# Patient Record
Sex: Female | Born: 2000
Health system: Southern US, Community
[De-identification: ages and names within clinical notes are randomized; demographics above are authoritative.]

## PROBLEM LIST (undated history)

## (undated) DIAGNOSIS — F32A Depression, unspecified: Secondary | ICD-10-CM

## (undated) DIAGNOSIS — F419 Anxiety disorder, unspecified: Secondary | ICD-10-CM

## (undated) DIAGNOSIS — F329 Major depressive disorder, single episode, unspecified: Secondary | ICD-10-CM

## (undated) DIAGNOSIS — L301 Dyshidrosis [pompholyx]: Secondary | ICD-10-CM

## (undated) DIAGNOSIS — F332 Major depressive disorder, recurrent severe without psychotic features: Secondary | ICD-10-CM

## (undated) DIAGNOSIS — B029 Zoster without complications: Secondary | ICD-10-CM

## (undated) DIAGNOSIS — T7840XA Allergy, unspecified, initial encounter: Secondary | ICD-10-CM

## (undated) DIAGNOSIS — H539 Unspecified visual disturbance: Secondary | ICD-10-CM

## (undated) HISTORY — DX: Allergy, unspecified, initial encounter: T78.40XA

## (undated) HISTORY — DX: Dyshidrosis (pompholyx): L30.1

## (undated) HISTORY — DX: Zoster without complications: B02.9

## (undated) HISTORY — DX: Major depressive disorder, recurrent severe without psychotic features: F33.2

---
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
## 2018-01-03 ENCOUNTER — Other Ambulatory Visit: Payer: Self-pay | Admitting: Family Medicine

## 2018-01-03 MED FILL — TRI FEMYNOR 28 TABLET: 0.18/0.215/ | 84 days supply | Qty: 84 | Fill #0

## 2018-03-14 MED FILL — TRI FEMYNOR 28 TABLET: 0.18/0.215/ | 84 days supply | Qty: 84 | Fill #1

## 2018-04-05 DIAGNOSIS — H5213 Myopia, bilateral: Secondary | ICD-10-CM | POA: Diagnosis not present

## 2018-06-01 MED FILL — TRI FEMYNOR 28 TABLET: 0.18/0.215/ | 84 days supply | Qty: 84 | Fill #2

## 2018-08-15 ENCOUNTER — Other Ambulatory Visit: Payer: Self-pay | Admitting: Family Medicine

## 2018-08-15 MED FILL — TRI FEMYNOR 28 TABLET: 0.18/0.215/ | 84 days supply | Qty: 84 | Fill #0

## 2018-09-27 NOTE — Patient Instructions (Addendum)
HEALTH MAINTENANCE RECOMMENDATIONS:  It is recommended that you get at least 30 minutes of aerobic exercise at least 5 days/week (for weight loss, you may need as much as 60-90 minutes). This can be any activity that gets your heart rate up. This can be divided in 10-15 minute intervals if needed, but try and build up your endurance at least once a week.  Weight bearing exercise is also recommended twice weekly.  Eat a healthy diet with lots of vegetables, fruits and fiber.  "Colorful" foods have a lot of vitamins (ie green vegetables, tomatoes, red peppers, etc).  Limit sweet tea, regular sodas and alcoholic beverages, all of which has a lot of calories and sugar.  Drink a lot of water.  Calcium recommendations are 1200-1500 mg daily (1500 mg for postmenopausal women or women without ovaries), and vitamin D 1000 IU daily.  This should be obtained from diet and/or supplements (vitamins), and calcium should not be taken all at once, but in divided doses.  Monthly self breast exams and yearly mammograms for women over the age of 20 is recommended.  Sunscreen of at least SPF 30 should be used on all sun-exposed parts of the skin when outside between the hours of 10 am and 4 pm (not just when at beach or pool, but even with exercise, golf, tennis, and yard work!)  Use a sunscreen that says "broad spectrum" so it covers both UVA and UVB rays, and make sure to reapply every 1-2 hours.  Remember to change the batteries in your smoke detectors when changing your clock times in the spring and fall. Carbon monoxide detectors are recommended for the home.  Use your seat belt every time you are in a car, and please drive safely and not be distracted with cell phones and texting while driving.  Condoms are recommended for prevention of sexually transmitted diseases. Avoid alcohol, cigarettes, vaping and other recreational drugs.  We are going to check blood work (as well as the EKG done today) to evaluate  your rapid heartbeat episodes. I suspect paroxysmal SVT (see information below).  If your labs are normal, we are going to send you to the cardiologist to get a heart monitor, and possibly and echocardiogram (ultrasound of the heart). As we discussed, try and count your pulse when having one of these episodes (how many beats per minute--okay to check for 15 seconds and multiply times 4 if that is easier); and try to see if there are any triggers (ie dehydration) that can be avoided.   Supraventricular Tachycardia, Adult Supraventricular tachycardia (SVT) is a kind of abnormal heartbeat. It makes your heart beat very fast and then beat at a normal speed. A normal resting heartbeat is 60-100 times a minute. This condition can make your heart beat more than 150 times a minute. Times of having a fast heartbeat (episodes) can be scary, but they are usually not dangerous. However, in some cases, they can lead to heart failure if:  They happen several times per day.  Last for longer than a few seconds. What are the causes? Usually, a normal heartbeat starts when an area called the sinoatrial node releases an electrical signal. In SVT, other areas of the heart send out electrical signals that interfere with the signal from the sinoatrial node. What increases the risk?  Being [age] years old.  Being a woman. The following factors may make you more likely to develop this condition:  Stress.  Tiredness.  Smoking.  Stimulant drugs, such  as cocaine and methamphetamine  Alcohol.  Caffeine.  Pregnancy.  Anxiety. What are the signs or symptoms?  A pounding heart. ? A feeling that your heart is skipping beats (palpitations). ? Weakness. ? Trouble getting enough air. ? Pain or tightness in your chest. ? Feeling like you are going to pass out. ? Feeling worried or nervous. ? Dizziness. ? Sweating. ? Feeling like you might throw up. ? Passing out (fainting). ? Tiredness. Sometimes, there  are no symptoms. How is this treated?  Vagal nerve stimulation. Ways to do this: ? Holding your breath and pushing, as though you are pooping. ? Massaging an area on one side of your neck. Do not try this yourself. Only a doctor should do this. If done the wrong way, it can lead to a stroke. ? Bending forward with your head between your legs. ? Coughing while bending forward with your head between your legs. ? Closing your eyes and massaging your eyeballs. Ask a doctor how to do this.  Medicines that prevent attacks.  Medicine to stop an attack given through an IV at the hospital.  A small electric shock (cardioversion) that stops an attack.  Radiofrequency ablation. In this procedure, a small, thin tube (catheter) is used to send radiofrequency energy to the area that is causing the rapid heartbeats. Follow these instructions at home: Stress   Avoid things that make you feel stressed.  To deal with stress, try: ? Doing yoga, meditation, or being out in nature. ? Listening to relaxing music. ? Doing deep breathing. ? Taking steps to be healthy, such as getting lots of sleep, exercising, and eating a balanced diet. ? Talking with a mental health doctor. Sleep  Try to get at least 7 hours of sleep each night. Tobacco and nicotine   Do not use any products that contain nicotine or tobacco, such as cigarettes, e-cigarettes, and chewing tobacco. If you need help quitting, ask your doctor. Alcohol  If alcohol gives you a fast heartbeat, do not drink alcohol.  Even in alcohol does not seem to give you a fast heartbeat, limit alcohol use,. ? For nonpregnant women, this means no more than 1 drink a day. ? For men, this means no more than 2 drinks a day.  "One drink" means one of these:  12 oz of beer (355 mL).  5 oz of wine (148 mL)  1 oz of hard liquor (44 mL). Caffeine  If caffeine gives you a fast heartbeat, do not eat, drink, or use anything with caffeine in  it.  Even if caffeine does not seem to give you a fast heartbeat, limit how much caffeine you eat, drink, or use. Stimulant drugs  Do not use drugs such as cocaine or methamphetamine. If you need help quitting, ask your doctor. General instructions  Stay at a healthy weight.  Exercise regularly. Ask your doctor about good activities for you. Try one of these: ? 150 minutes a week of gentle exercise, like walking or yoga. ? 75 minutes a week of exercise that is very active, like running or swimming.  Try a combination of gentle exercise and very active exercise.  Do home treatments to slow down your heartbeat.  Take over-the-counter and prescription medicines only as told by your doctor. Contact a doctor if:  You have a fast heartbeat more often.  Times of having a fast heartbeat last longer than before.  Home treatments to slow down your heartbeat do not help.  You have new  symptoms. Get help right away if:  You have chest pain.  Your symptoms get worse.  You have trouble breathing.  Your heart beats very fast for more than 20 minutes.  You pass out. These symptoms may be an emergency. Do not wait to see if the symptoms will go away. Get medical help right away. Call your local emergency services (911 in the [place] Do not drive yourself to the hospital. Summary  SVT is a type of abnormal heart beat.  During an episode, our heart rate may be higher than 150 beats per minute  Treatment depends on how often the condition happens and your symptoms. This information is not intended to replace advice given to you by your health care provider. Make sure you discuss any questions you have with your health care provider. Document Released: 08/10/2005 Document Revised: 04/16/2016 Document Reviewed: 04/16/2016 Elsevier Interactive Patient Education  2019 [employer].

## 2018-09-27 NOTE — Progress Notes (Signed)
Chief Complaint  Patient presents with  . Annual Exam    nonfasting (had lunch at noon) annual exam. Sees eye doctor yearly. No concerns.     Julia Vasquez is a 18 y.o. female who presents for a complete physical.  She has the following concerns:  She will notice her heart beating very fast, feels it up into her throat.  It will occur sometimes while at rest, sometimes when dancing.    Starts abruptly. Occurring about once a week.  No associated chest pain or shortness of breath, not lightheaded/dizzy with episodes.  Lasts a few minutes, then spontaneously goes back to a normal heartbeat (sudden switch, not gradual). First noticed a couple of months ago, increased in frequency Tea 2x/week, otherwise no caffeine. Denies decongestants or other stimulants. Denies being dehydrated when this occurs.  Hand eczema:  Starting to flare again. Has been using Eucrisa (samples she got from dermatologist), which helps.  Gets migraines once a month, relieved by Aleve or Tylenol. Sleep paralysis--not as often in the past, just up to 2x/month.   Well child screening reviewed in epic. Alcohol (irregular use; last drank a couple of months ago, just 1 drink) Marijuana in past, not regular, last a couple of months ago. Tobacco exposure: boyfriend, mother and brother (smokes outside) 5 hours screen time/d--mostly watching TV No longer making the YouTube makeup videos (looking to find a new app). +sexually active. Uses condoms.  Reports she and her boyfriend broke up in August, got back together in November, and became sexually active since getting back together.  +condom use, and is on OCP's. PHQ-2 score of 0.  She wears contacts, sees eye doctor yearly.    Hyperlipidemia:  This was noted on screening labs in 2017.  Her mother has hyperlipidemia and is on statin, and there is FHx of CAD (MGM).  We reviewed low cholesterol diet and her last visit, but she never returned for recheck as recommended. She  is not fasting today.  Lab Results  Component Value Date   CHOL 268 (H) 06/10/2016   HDL 59 06/10/2016   LDLCALC 162 (H) 06/10/2016   TRIG 236 (H) 06/10/2016   CHOLHDL 4.5 06/10/2016   At her visit last year (prior to discussion of low cholesterol diet), she reported the following diet: Red meat daily (meatloaf, in pasta, chili), eggs just once/week.  +ranch dressing, creamy soup (potato), ice cream, alfredo sauces. Some fried chicken. +sweet tea daily.  Current diet: no longer drinks much sweet tea (2x/week), eating more chicken, less red meat (2x/week--tacos, spaghetti, lasagna); no longer having much alfredo sauce.  +ranch dressing; frequent salads, with some parmesan cheese.  H/o Depression/Anxiety:She is no longer being seen byDr. Dwyane Dee. She was hospitalized with SI in the past (02/2015).She [facility] saw [facility]. Denies any problems currently with depression, anxiety. No longer takes hydroxyzine.  She falls asleep fine, still sometimes has sleep paralysis, less often (2x/month).  Acne: Usingan OTC face wash, and is on OCP's, and skin is doing very well.  Chest pain: has some pain a couple of months ago, but overall no longer gets much chest pain. (h/o frequent costochondritis)  Menarche: age 73 On OCP's. Had some irregular periods prior to being started, and also had dysmenorrhea. Menses are regular now on OCPs.Periods lasts for 5 days, heavy for the first day only. She still has cramps, only on the first day (not as bad as in the past, when she used to miss school each month). She  takes tylenol the first day, which is effective. In a relationship with same person x 5 years.  Broke up in August, back together since November. Uses condoms, denies unprotected sex. Denies any vaginal discharge, lesions or other concerns  Education:[place] then finished credits online [employer], graduated the end of 2017.  Wants  business degree--plans online school at some point in future. Would like to own her own business (unsure if salon, restaurant).   Immunization History  Administered Date(s) Administered  . DTaP 01/31/1999, 04/03/1999, 06/20/1999, 06/03/2000, 12/11/2003  . H1N1 06/18/2008, 07/18/2008  . HPV Quadrivalent 03/19/2010, 08/22/2010, 05/04/2013  . Hepatitis A 06/29/2006  . Hepatitis A, Ped/Adol-2 Dose 05/04/2013  . Hepatitis B 01/31/1999, 09/03/1999, 12/07/1999  . HiB (PRP-OMP) 04/03/1999, 06/20/1999, 06/03/2000  . IPV 01/31/1999, 06/20/1999, 09/03/1999, 06/03/2000  . Influenza Whole 06/07/2006  . Influenza,Quad,Nasal, Live 05/31/2013, 07/17/2014  . MMR 12/25/1999, 12/11/2003  . Meningococcal Conjugate 05/04/2013  . Pneumococcal Conjugate-13 01/31/1999, 06/20/1999, 09/03/1999, 06/03/2000  . Tdap 03/19/2010  . Varicella 12/25/1999  +varicella IgG 05/2016  Dentist: twice a year Ophtho: yearly Exercise: Yoga daily; dancing and weights daily. At least 30 mins of cardio daily.  She has repeatedly declined recommended vaccinations including Menveo#2, Bexsero, flu shots (and advised that the meningitis vaccines would only be needed if going back to school).  Declines flu shot again today.  Vitamin D-OH 33 in 05/2016  Past Medical History:  Diagnosis Date  . Allergy   . Anxiety   . Chest pain   . Depression   . Dyshidrotic eczema    hand; sees Dr. LLedell PeoplesPA ([place] [facility]  . Shingles 07/2010  . Vision abnormalities    wears contacts    History reviewed. No pertinent surgical history.  Social History   Socioeconomic History  . Marital status: Single    Spouse name: Not on file  . Number of children: Not on file  . Years of education: Not on file  . Highest education level: Not on file  Occupational History  . Not on file  Social Needs  . Financial resource strain: Not on file  . Food insecurity:    Worry: Not on file    Inability: Not on file  . Transportation needs:     Medical: Not on file    Non-medical: Not on file  Tobacco Use  . Smoking status: Former SResearch scientist (life sciences) . Smokeless tobacco: Never Used  . Tobacco comment: smoked just occasionally for 1 year (age 89  Substance and Sexual Activity  . Alcohol use: No    Alcohol/week: 0.0 standard drinks    Comment: infrequent  . Drug use: Not Currently    Types: Marijuana    Comment: has occasionally used marijuana  . Sexual activity: Yes    Partners: Male    Birth control/protection: Condom, Pill  Lifestyle  . Physical activity:    Days per week: Not on file    Minutes per session: Not on file  . Stress: Not on file  Relationships  . Social connections:    Talks on phone: Not on file    Gets together: Not on file    Attends religious service: Not on file    Active member of club or organization: Not on file    Attends meetings of clubs or organizations: Not on file    Relationship status: Not on file  . Intimate partner violence:    Fear of current or ex partner: Not on file  Emotionally abused: Not on file    Physically abused: Not on file    Forced sexual activity: Not on file  Other Topics Concern  . 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 Graduated end of 2017.   Lives full time with mother, brother, brother's friend. Rolla Plate (her boyfriend) no longer lives in the home.  Sister is now living with her dad.    +tobacco exposure from mother and boyfriend and brother   + 4 cats (outside), 1 inside cat, and 6 inside dogs (3 little, 3 big).    Family History  Problem Relation Age of Onset  . Hyperlipidemia Mother   . Arthritis Father   . Gout Father   . Asthma Brother        exercise induced  . Bipolar disorder Brother   . Heart disease Maternal Grandmother        died of MI at 65  . Diabetes Maternal Grandfather   . Cancer Maternal Grandfather        colorectal  .  Colon cancer Maternal Grandfather 13  . Asthma Paternal Grandmother   . Hypertension Paternal Grandmother   . Diabetes Paternal Grandfather   . Diabetes Maternal Aunt     Outpatient Encounter Medications as of 09/28/2018  Medication Sig Note  . Multiple Vitamins-Minerals (MULTI-VITAMIN GUMMIES) CHEW Chew 1 tablet by mouth daily.   . TRI FEMYNOR 0.18/0.215/0.25 MG-35 MCG tablet TAKE 1 TABLET BY MOUTH ONCE DAILY   . Azelastine-Fluticasone 137-50 MCG/ACT SUSP Place 1 spray into the nose 2 (two) times daily. (Patient not taking: Reported on 09/22/2017) 06/10/2016: Uses prn allergies, not currently  . [DISCONTINUED] hydrocortisone cream 0.5 % Apply 1 application topically 2 (two) times daily.    No facility-administered encounter medications on file as of 09/28/2018.     Allergies  Allergen Reactions  . Cefuroxime Axetil Diarrhea  . Amoxil [Amoxicillin Trihydrate] Rash    ROS: Deniesfever, chills,dizziness. Denies heartburn.Normal bowels. Denies urinary complaints, vaginal discharge. Menses are regular on OCP's, cramps/painfor first day only. Currently denies allergies/congestion.No cough, shortness of breath. Denies depresssion, anxiety. Occasional ringing in ears, fluctuates back and forth between sides, short-lived. Not as often as in the past.  Denies hearing loss. +loud music. No further rash on her fingertips, but recently started having eczema flare on her knuckles again. Headaches, sleep paralysis and tachycardia, per HPI.   PHYSICAL EXAM:  BP 110/76   Pulse 68   Ht 5' 5"  (1.651 m)   Wt 135 lb 12.8 oz (61.6 kg)   LMP 08/28/2018 (Approximate)   BMI 22.60 kg/m   Wt Readings from Last 3 Encounters:  09/28/18 135 lb 12.8 oz (61.6 kg) (63 %, Z= 0.34)*  09/22/17 144 lb 9.6 oz (65.6 kg) (78 %, Z= 0.76)*  06/10/16 144 lb 3.2 oz (65.4 kg) (81 %, Z= 0.87)*   * Growth percentiles are based on CDC (Girls, 2-20 Years) data.    Well developed, pleasant female in no distress.In  good spirits HEENT: PERRL, EOMI, conjunctiva clear.TM's and EAC's normal. OP normal, very mild erythema of ATP, tonsils are normal. Moist mucus membranes. Nasal mucosa ismildly edematous with clear mucus noted. Sinuses are nontender Neck: supple Some shotty, nontender anterior cervical lymphadenopathy bilaterally.  No masses or thyromegaly, no carotid bruit. Heart: regular rate and rhythm without murmurs or ectopy Lungs: clear bilaterally with good air movement  Chest: nontender to palpation  Breasts: No nipple inversion, discharge, skin dimpling, breast masses or axillary lymphadenopathy. Tissue is asymmetric, more fibroglandular on left than on right. No dominant masses, nontender. Abdomen: soft, nontender, no organomegaly or mass.  Skin: skin on face and back is clear, without any significant acne. Dry, rough, thickened skin at her left 2nd MCP, less so over the remaining knuckles bilaterally, with a small spot on her right index finger as well. Extremities: No edema, normal pulses Neuro: alert and oriented. Normal strength, DTR's 2+. Normal gait  Back: no spinal tenderness, CVA tenderness. No scoliosis  Psych: normal mood, affect, hygiene and grooming.  Urine dip normal   ASSESSMENT/PLAN:  Annual physical exam - Plan: POCT Urinalysis DIP (Proadvantage Device), Lipid panel, CBC with Differential/Platelet, TSH, Comprehensive metabolic panel, HIV Antibody (routine testing w rflx), GC/Chlamydia Probe Amp  Mixed hyperlipidemia - reviewed lowfat, low cholesterol diet. To return for fasting lipids - Plan: Lipid panel  Dysmenorrhea - significantly improved on OCP's  Acne vulgaris - well controlled  Palpitations - suspect pSVT based on history. Normal EKG.  Check labs. Refer to cardiologist for eval/monitor after labs back. - Plan: CBC with Differential/Platelet, TSH, Comprehensive metabolic panel, EKG 74-FSEL  Hand eczema - counseled re: moisturizing, limiting hot water exposure,  eucrisa    Fasting lipids due--not fasting today; needs to return for labs Lipid, c-met, CBC, TSH, HIV  Previously declined all vaccines.  With no plans for college, only recommended flu shot today--declined.  Will need Menveo #2 and Bexsero #1 before attending college (not currently in her plans).  Counseled extensively re:  Low cholesterol diet,safe sex, substance abuse.Counseled re: sunscreen, seat belt, internet safety, safe social media Handout given

## 2018-09-28 ENCOUNTER — Encounter: Payer: Self-pay | Admitting: Family Medicine

## 2018-09-28 ENCOUNTER — Ambulatory Visit (INDEPENDENT_AMBULATORY_CARE_PROVIDER_SITE_OTHER): Payer: Commercial Managed Care - PPO | Admitting: Family Medicine

## 2018-09-28 VITALS — BP 110/76 | HR 68 | Ht 65.0 in | Wt 135.8 lb

## 2018-09-28 DIAGNOSIS — R002 Palpitations: Secondary | ICD-10-CM | POA: Diagnosis not present

## 2018-09-28 DIAGNOSIS — E782 Mixed hyperlipidemia: Secondary | ICD-10-CM

## 2018-09-28 DIAGNOSIS — N946 Dysmenorrhea, unspecified: Secondary | ICD-10-CM

## 2018-09-28 DIAGNOSIS — L700 Acne vulgaris: Secondary | ICD-10-CM

## 2018-09-28 DIAGNOSIS — L309 Dermatitis, unspecified: Secondary | ICD-10-CM

## 2018-09-28 DIAGNOSIS — Z0000 Encounter for general adult medical examination without abnormal findings: Secondary | ICD-10-CM

## 2018-09-28 LAB — POCT URINALYSIS DIP (PROADVANTAGE DEVICE)
Bilirubin, UA: NEGATIVE
Blood, UA: NEGATIVE
GLUCOSE UA: NEGATIVE mg/dL
Ketones, POC UA: NEGATIVE mg/dL
Leukocytes, UA: NEGATIVE
Nitrite, UA: NEGATIVE
Protein Ur, POC: NEGATIVE mg/dL
Specific Gravity, Urine: 1.015
Urobilinogen, Ur: NEGATIVE
pH, UA: 7.5 (ref 5.0–8.0)

## 2018-09-30 ENCOUNTER — Other Ambulatory Visit: Payer: Self-pay | Admitting: Family Medicine

## 2018-09-30 ENCOUNTER — Encounter: Payer: Self-pay | Admitting: Family Medicine

## 2018-09-30 DIAGNOSIS — Z113 Encounter for screening for infections with a predominantly sexual mode of transmission: Secondary | ICD-10-CM

## 2018-09-30 DIAGNOSIS — A749 Chlamydial infection, unspecified: Secondary | ICD-10-CM

## 2018-09-30 HISTORY — DX: Chlamydial infection, unspecified: A74.9

## 2018-09-30 LAB — GC/CHLAMYDIA PROBE AMP
Chlamydia trachomatis, NAA: POSITIVE — AB
Neisseria gonorrhoeae by PCR: NEGATIVE

## 2018-09-30 MED ORDER — AZITHROMYCIN 500 MG PO TABS: 1000.0000 mg | ORAL_TABLET | Freq: Every day | ORAL | 0 refills | Status: DC

## 2018-09-30 MED FILL — AZITHROMYCIN 500 MG TABLET: 500 | 2 days supply | Qty: 2 | Fill #0

## 2018-10-04 ENCOUNTER — Encounter: Payer: Self-pay | Admitting: Family Medicine

## 2018-10-05 ENCOUNTER — Encounter: Payer: Self-pay | Admitting: Family Medicine

## 2018-10-17 ENCOUNTER — Ambulatory Visit: Payer: Commercial Managed Care - PPO | Admitting: Family Medicine

## 2018-10-17 ENCOUNTER — Other Ambulatory Visit: Payer: 59

## 2018-10-17 DIAGNOSIS — E782 Mixed hyperlipidemia: Secondary | ICD-10-CM

## 2018-10-17 DIAGNOSIS — Z202 Contact with and (suspected) exposure to infections with a predominantly sexual mode of transmission: Secondary | ICD-10-CM | POA: Diagnosis not present

## 2018-10-17 DIAGNOSIS — R002 Palpitations: Secondary | ICD-10-CM

## 2018-10-17 DIAGNOSIS — Z113 Encounter for screening for infections with a predominantly sexual mode of transmission: Secondary | ICD-10-CM | POA: Diagnosis not present

## 2018-10-17 DIAGNOSIS — Z0000 Encounter for general adult medical examination without abnormal findings: Secondary | ICD-10-CM | POA: Diagnosis not present

## 2018-10-18 LAB — COMPREHENSIVE METABOLIC PANEL WITH GFR
ALT: 10 IU/L (ref 0–32)
AST: 16 IU/L (ref 0–40)
Albumin/Globulin Ratio: 1.7 (ref 1.2–2.2)
Albumin: 4.7 g/dL (ref 3.9–5.0)
Alkaline Phosphatase: 73 IU/L (ref 39–117)
BUN/Creatinine Ratio: 12 (ref 9–23)
BUN: 9 mg/dL (ref 6–20)
Bilirubin Total: 0.3 mg/dL (ref 0.0–1.2)
CO2: 20 mmol/L (ref 20–29)
Calcium: 9.8 mg/dL (ref 8.7–10.2)
Chloride: 102 mmol/L (ref 96–106)
Creatinine, Ser: 0.76 mg/dL (ref 0.57–1.00)
GFR calc Af Amer: 132 mL/min/1.73
GFR calc non Af Amer: 114 mL/min/1.73
Globulin, Total: 2.8 g/dL (ref 1.5–4.5)
Glucose: 76 mg/dL (ref 65–99)
Potassium: 4.3 mmol/L (ref 3.5–5.2)
Sodium: 140 mmol/L (ref 134–144)
Total Protein: 7.5 g/dL (ref 6.0–8.5)

## 2018-10-18 LAB — LIPID PANEL
Chol/HDL Ratio: 3.6 ratio (ref 0.0–4.4)
Cholesterol, Total: 236 mg/dL — ABNORMAL HIGH (ref 100–169)
HDL: 65 mg/dL
LDL Calculated: 134 mg/dL — ABNORMAL HIGH (ref 0–109)
Triglycerides: 185 mg/dL — ABNORMAL HIGH (ref 0–89)
VLDL Cholesterol Cal: 37 mg/dL (ref 5–40)

## 2018-10-18 LAB — CBC WITH DIFFERENTIAL/PLATELET
BASOS: 1 %
Basophils Absolute: 0.1 10*3/uL (ref 0.0–0.2)
EOS (ABSOLUTE): 0.1 10*3/uL (ref 0.0–0.4)
EOS: 2 %
Hematocrit: 39.5 % (ref 34.0–46.6)
Hemoglobin: 13.7 g/dL (ref 11.1–15.9)
Immature Grans (Abs): 0 10*3/uL (ref 0.0–0.1)
Immature Granulocytes: 0 %
Lymphocytes Absolute: 3.3 10*3/uL — ABNORMAL HIGH (ref 0.7–3.1)
Lymphs: 34 %
MCH: 30.8 pg (ref 26.6–33.0)
MCHC: 34.7 g/dL (ref 31.5–35.7)
MCV: 89 fL (ref 79–97)
Monocytes Absolute: 0.4 10*3/uL (ref 0.1–0.9)
Monocytes: 5 %
Neutrophils Absolute: 5.6 10*3/uL (ref 1.4–7.0)
Neutrophils: 58 %
Platelets: 301 10*3/uL (ref 150–450)
RBC: 4.45 x10E6/uL (ref 3.77–5.28)
RDW: 11.7 % (ref 11.7–15.4)
WBC: 9.6 10*3/uL (ref 3.4–10.8)

## 2018-10-18 LAB — TSH: TSH: 2.73 u[IU]/mL (ref 0.450–4.500)

## 2018-10-18 LAB — RPR: RPR Ser Ql: NONREACTIVE

## 2018-10-18 LAB — HIV ANTIBODY (ROUTINE TESTING W REFLEX): HIV Screen 4th Generation wRfx: NONREACTIVE

## 2018-10-19 LAB — CHLAMYDIA TRACHOMATIS, DNA, AMP PROBE: CHLAMYDIA, DNA PROBE: NEGATIVE

## 2018-12-05 ENCOUNTER — Other Ambulatory Visit: Payer: Self-pay | Admitting: Family Medicine

## 2018-12-05 MED FILL — TRI FEMYNOR 28 TABLET: 0.18/0.215/ | 84 days supply | Qty: 84 | Fill #0

## 2019-02-16 MED FILL — TRI FEMYNOR 28 TABLET: 0.18/0.215/ | 84 days supply | Qty: 84 | Fill #1

## 2019-03-29 DIAGNOSIS — H52223 Regular astigmatism, bilateral: Secondary | ICD-10-CM | POA: Diagnosis not present

## 2019-03-29 DIAGNOSIS — H5213 Myopia, bilateral: Secondary | ICD-10-CM | POA: Diagnosis not present

## 2019-05-09 MED FILL — TRI FEMYNOR 28 TABLET: 0.18/0.215/ | 84 days supply | Qty: 84 | Fill #2

## 2019-07-24 MED FILL — TRI FEMYNOR 28 TABLET: 0.18/0.215/ | 84 days supply | Qty: 84 | Fill #0

## 2019-11-06 ENCOUNTER — Other Ambulatory Visit: Payer: Self-pay | Admitting: Family Medicine

## 2019-11-06 MED FILL — TRI FEMYNOR 28 TABLET: 0.18/0.215/ | 28 days supply | Qty: 28 | Fill #0

## 2019-11-06 NOTE — Telephone Encounter (Signed)
Patient said she is out of these now and scheduled for April. Can I fill?

## 2019-11-06 NOTE — Telephone Encounter (Signed)
Pt made a CPE for 11/27/19 pt needs refill on bc please send to the [facility]

## 2019-11-06 NOTE — Telephone Encounter (Signed)
Sent in 1 pack only.

## 2019-11-22 ENCOUNTER — Telehealth: Payer: Self-pay | Admitting: Family Medicine

## 2019-11-22 MED ORDER — NORGESTIM-ETH ESTRAD TRIPHASIC 0.18/0.215/0.25 MG-35 MCG PO TABS: 1.0000 | ORAL_TABLET | Freq: Every day | ORAL | 0 refills | Status: DC

## 2019-11-22 NOTE — Telephone Encounter (Signed)
Is this okay. Was sent to me.

## 2019-11-22 NOTE — Telephone Encounter (Signed)
Another pack was sent to [facility] pharmacy

## 2019-11-22 NOTE — Telephone Encounter (Signed)
Julia Vasquez changed her CPE to April 26th because she will be on her cycle that day but will need a refill on her birth control for at least 1 month

## 2019-11-27 ENCOUNTER — Encounter: Payer: 59 | Admitting: Family Medicine

## 2019-11-28 MED FILL — TRI FEMYNOR 28 TABLET: 0.18/0.215/ | 28 days supply | Qty: 28 | Fill #0

## 2019-12-17 NOTE — Patient Instructions (Addendum)
You will be due for a Tdap (tetanus booster) in July.  You declined this today. Be sure to return at some point for a nurse visit to get this.  It can be done at the same time as a flu shot if you decide you want to wait until after the summer, when lifeguarding is over.  Please quit smoking!  COVID vaccine is also highly recommended.  You can get this anytime (you are eligible).  You do need to wait 2 weeks after any other vaccine, or wait 2 weeks after the second dose before getting other vaccines.  ([place] require 2 doses).  You can get more information on [place] DHHS website, or [email or web address], or through [facility].  You can also get vaccines through some [facility] and [facility].  COVID-19 Vaccine Information can be found at: ShippingScam.co.uk For questions related to vaccine distribution or appointments, please email [email or web address] or call [phone].     Well Child Safety, Young Adult This sheet provides general safety recommendations. Talk with a health care provider if you have any questions. Home safety  Make sure your home or apartment has smoke detectors and carbon monoxide detectors. Test them once a month. Change their batteries every year.  If you keep guns and ammunition in the home, make sure they are stored separately and locked away.  Make your home a tobacco-free and drug-free environment. Motor vehicle safety  Wear a seat belt whenever you drive or ride in a vehicle.  Do not text, talk, or use your phone or other mobile devices while driving.  Do not drive when you are tired. If you feel like you may fall asleep while driving, pull over at a safe location and take a break or switch drivers.  Do not drive after drinking or using drugs. Plan for a designated driver or another way to go home.  Do not ride in a car with someone who has been using drugs or alcohol.  Do not ride in the bed or  cargo area of a pickup truck. Sun safety  Use broad-spectrum sunscreen that protects against UVA and UVB radiation (SPF 15 or higher). ? Put on sunscreen 15-30 minutes before going outside. ? Reapply sunscreen every 2 hours, or more often if you get wet or if you are sweating. ? Use enough sunscreen to cover all exposed areas. Rub it in well.  Wear sunglasses when you are out in the sun.  Do not use tanning beds. Tanning beds are just as harmful for your skin as the sun. Water safety  Never swim alone.  Only swim in designated areas.  Do not swim in areas where you do not know the water conditions or where underwater hazards are located. General instructions  Protect your hearing and avoid exposure to loud music or noises by: ? Wearing ear protection when you are in a noisy environment (while using loud machinery, like a lawn mower, or at concerts). ? Making sure that the volume is not too loud when listening to music in the car or through headphones.  Avoid tattoos and body piercings. Tattoos and body piercings can get infected. Personal safety  Do not use tobacco, drugs, anabolic steroids, or diet pills.  Do not drink or use drugs while swimming, boating, riding a bike or motorcycle, or using heavy machinery.  Do not drink heavily (binge drink). Your brain is still developing, and alcohol can affect your brain development.  Wear protective gear for sports and other physical activities, such  as a helmet, mouth guard, eye protection, wrist guards, elbow pads, and knee pads. Wear a helmet when biking, riding a motorcycle or all-terrain vehicle (ATV), skateboarding, skiing, or snowboarding.  If you are sexually active, practice safe sex. Use a condom or other form of birth control (contraception) in order to prevent pregnancy and STIs (sexually transmitted infections).  Never leave a party or event alone without telling a friend that you are leaving. Never leave with a  stranger.  Do not misuse medicines. This means that you should not take a medicine other than how it is prescribed and you should not take someone else's medicine.  Avoid risky situations or situations where you do not feel safe. Call for help if you find yourself in an unsafe situation.  Learn to manage conflict without using violence.  Never accept a drink from a stranger if you do not know where the drink came from.  Avoid people who suggest unsafe or harmful behavior, and avoid unhealthy romantic relationships or friendships where you do not feel respected. No one has the right to pressure you into any activity that makes you feel uncomfortable. If others make you feel unsafe, you can: ? Ask for help from your parents or guardians, your health care provider, or other trusted adults like a Pharmacist, hospital, coach, or counselor. ? Call the [place] at [phone] or go online: www.thehotline.org Where to find more information:  American Academy of Pediatrics: www.healthychildren.org  Centers for Disease Control and Prevention: http://www.wolf.info/ Summary  Protect yourself from sun exposure by using broad-spectrum sunscreen that protects against UVA and UVB radiation (SPF 15 or higher).  Wear appropriate protective gear when playing sports and doing other activities. Gear may include a helmet, mouth guard, eye protection, wrist guards, and elbow and knee pads.  Be safe when driving or riding in vehicles. While driving: Wear a seat belt. Do not use your mobile device. Do not drink or use drugs.  Always be aware of your surroundings. Avoid risky situations or places where you feel unsafe.  Avoid relationships or friendships in which you do not feel respected. It is okay to ask for help from your parents or guardians, your health care provider, or other trusted adults like a Pharmacist, hospital, coach, or counselor. This information is not intended to replace advice given to you by your  health care provider. Make sure you discuss any questions you have with your health care provider. Document Revised: 01/30/2019 Document Reviewed: 03/22/2017 Elsevier Patient Education  [place].   Well Child Nutrition, Young Adult This sheet provides general nutrition recommendations. Talk with a health care provider or a diet and nutrition specialist (dietitian) if you have any questions. Nutrition  The amount of food you need to eat every day depends on your age, sex, size, and activity level. To figure out your daily calorie needs, look for a calorie calculator online or talk with your health care provider. Balanced diet Eat a balanced diet. Try to include:  Fruits. Aim for 2 cups a day. Examples of 1 cup of fruit include 1 large banana, 1 small apple, 8 large strawberries, or 1 large orange. Eat a variety of whole fruits and 100% fruit juice. Choose fresh, canned, frozen, or dried forms. Choose canned fruit that has the lowest added sugar or no added sugar.  Vegetables. Aim for 2-3 cups a day. Examples of 1 cup of vegetables include 2 medium carrots, 1 large tomato, or 2 stalks of celery. Choose fresh,  frozen, canned, and dried options. Eat vegetables of a variety of colors.  Low-fat dairy. Aim for 3 cups a day. Examples of 1 cup of dairy include 8 oz (230 mL) of milk, 8 oz (230 g) of yogurt, or 1 oz (44 g) of natural cheese. Choose fat-free or low-fat dairy products, including milk, yogurt, and cheese. If you are unable to tolerate dairy (lactose intolerant) or you choose not to consume dairy, you may include fortified soy beverages (soy milk).  Whole grains. Of the grain foods that you eat each day (such as pasta, rice, and tortillas), aim to include 6-8 "ounce-equivalents" of whole-grain options. Examples of 1 ounce-equivalent of whole grains include 1 cup of whole-wheat cereal,  cup of brown rice, or 1 slice of whole-wheat bread. Try to choose whole grains including brown  rice, wild rice, quinoa, and oats.  Lean proteins. Aim for 5-6 "ounce-equivalents" a day. Eat a variety of protein foods, including lean meats, seafood, poultry, eggs, legumes (beans and peas), nuts, seeds, and soy products. ? A cut of meat or fish that is the size of a deck of cards is about 3-4 ounce-equivalents. ? Foods that provide 1 ounce-equivalent of protein include 1 egg,  cup of nuts or seeds, or 1 tablespoon (16 g) of peanut butter. For more information and options for foods in a balanced diet, visit www.BuildDNA.es Tips for healthy snacking  A snack should not be the size of a full meal. Eat snacks that have 200 calories or less. Examples include: ?  whole-wheat pita with  cup hummus. ? 2 or 3 slices of deli [place] wrapped around a cheese stick. ?  apple with 1 tablespoon of peanut butter. ? 10 baked chips with salsa.  Keep cut-up fruits and vegetables available at home and at school so they are easy to eat.  Pack healthy snacks the night before or when you pack your lunch.  Avoid pre-packaged foods. These tend to be higher in fat, sugar, and salt (sodium).  Get involved with shopping, or ask the primary food shopper in your household to get healthy snacks that you like.  Avoid chips, candy, cake, and soft drinks. Foods to avoid  Maceo Pro or heavily processed foods, such as toaster pastries and microwaveable dinners.  Drinks that contain a lot of sugar, such as sports drinks, sodas, and juice.  Foods that contain a lot of fat, sodium, or sugar. Food safety Prepare your food safely:  Wash your hands after handling raw meats.  Keep food preparation surfaces clean by washing them regularly with hot, soapy water.  Keep raw meats separate from foods that are ready-to-eat, such as fruits and vegetables.  Cook seafood, meat, poultry, and eggs to the recommended minimum safe internal temperature.  Store foods at safe temperatures. In general: ? Keep cold foods at  672F (4C) or colder. ? Keep your freezer at 72F (-18C or 18 degrees below 0C) or colder. ? Keep hot foods at 1672F (60C) or warmer. ? Foods are no longer safe to eat when they have been at a temperature of 40-1672F (4-60C) for more than 2 hours. Physical activity  Try to get 150 minutes of moderate-intensity physical activity each week. Examples include walking briskly or bicycling slower than 10 miles an hour (16 km an hour).  Do muscle-strengthening exercises on 2 or more days a week.  If you find it difficult to fit regular physical activity into your schedule, try: ? Taking the stairs instead of the elevator. ?  Parking your car farther from the entrance or at the back of the parking lot. ? Biking or walking to work or school.  If you need to lose weight, you may need to reduce your daily calorie intake and increase your daily amount of physical activity. Check with your health care provider before you start a new diet and exercise plan. General instructions  Do not skip meals, especially breakfast.  Water is the ideal beverage. Aim to drink six 8-oz glasses of water each day.  Avoid fad diets. These may affect your mood and growth.  If you choose to consume alcohol: ? Drink in moderation. This means two drinks a day for men and one drink a day for nonpregnant women. One drink equals 12 oz of beer, 5 oz of wine, or 1 oz of hard liquor.  You may drink coffee. It is recommended that you limit coffee intake to three to five 8-oz cups a day (up to 400 mg of caffeine).  If you are worried about your body image, talk with your parents, your health care provider, or another trusted adult like a coach or counselor. You may be at risk for developing an eating disorder. Eating disorders can lead to serious medical problems.  Food allergies may cause you to have a reaction (such as a rash, diarrhea, or vomiting) after eating or drinking. Talk with your health care provider if you have  concerns about food allergies. Summary  Eat a balanced diet. Include fruits, vegetables, low-fat dairy, whole grains, and lean proteins.  Try to get 150 minutes of moderate-intensity physical activity each week, and do muscle-strengthening exercises on 2 or more days a week.  Choose healthy snacks that are 200 calories or less.  Drink plenty of water. Try to drink six 8-oz glasses a day. This information is not intended to replace advice given to you by your health care provider. Make sure you discuss any questions you have with your health care provider. Document Revised: 11/29/2018 Document Reviewed: 03/24/2017 Elsevier Patient Education  2020 [place] 64-58 Years Old, Female Preventive care refers to lifestyle choices and visits with your health care provider that can promote health and wellness. At this stage in your life, you may start seeing a primary care physician instead of a pediatrician. Your health care is now your responsibility. Preventive care for young adults includes:  A yearly physical exam. This is also called an annual wellness visit.  Regular dental and eye exams.  Immunizations.  Screening for certain conditions.  Healthy lifestyle choices, such as diet and exercise. What can I expect for my preventive care visit? Physical exam Your health care provider may check:  Height and weight. These may be used to calculate body mass index (BMI), which is a measurement that tells if you are at a healthy weight.  Heart rate and blood pressure.  Body temperature. Counseling Your health care provider may ask you questions about:  Past medical problems and family medical history.  Alcohol, tobacco, and drug use.  Home and relationship well-being.  Access to firearms.  Emotional well-being.  Diet, exercise, and sleep habits.  Sexual activity and sexual health.  Method of birth control.  Menstrual cycle.  Pregnancy history. What  immunizations do I need?  Influenza (flu) vaccine  This is recommended every year. Tetanus, diphtheria, and pertussis (Tdap) vaccine  You may need a Td booster every 10 years. Varicella (chickenpox) vaccine  You may need this vaccine if you  have not already been vaccinated. Human papillomavirus (HPV) vaccine  If recommended by your health care provider, you may need three doses over 6 months. Measles, mumps, and rubella (MMR) vaccine  You may need at least one dose of MMR. You may also need a second dose. Meningococcal conjugate (MenACWY) vaccine  One dose is recommended if you are 3-51 years old and a Market researcher living in a residence hall, or if you have one of several medical conditions. You may also need additional booster doses. Pneumococcal conjugate (PCV13) vaccine  You may need this if you have certain conditions and were not previously vaccinated. Pneumococcal polysaccharide (PPSV23) vaccine  You may need one or two doses if you smoke cigarettes or if you have certain conditions. Hepatitis A vaccine  You may need this if you have certain conditions or if you travel or work in places where you may be exposed to hepatitis A. Hepatitis B vaccine  You may need this if you have certain conditions or if you travel or work in places where you may be exposed to hepatitis B. Haemophilus influenzae type b (Hib) vaccine  You may need this if you have certain risk factors. You may receive vaccines as individual doses or as more than one vaccine together in one shot (combination vaccines). Talk with your health care provider about the risks and benefits of combination vaccines. What tests do I need? Blood tests  Lipid and cholesterol levels. These may be checked every 5 years starting at age 63.  Hepatitis C test.  Hepatitis B test. Screening  Pelvic exam and Pap test. This may be done every 3 years starting at age 83.  Sexually transmitted disease (STD)  testing, if you are at risk.  BRCA-related cancer screening. This may be done if you have a family history of breast, ovarian, tubal, or peritoneal cancers. Other tests  Tuberculosis skin test.  Vision and hearing tests.  Skin exam.  Breast exam. Follow these instructions at home: Eating and drinking   Eat a diet that includes fresh fruits and vegetables, whole grains, lean protein, and low-fat dairy products.  Drink enough fluid to keep your urine pale yellow.  Do not drink alcohol if: ? Your health care provider tells you not to drink. ? You are pregnant, may be pregnant, or are planning to become pregnant. ? You are under the legal drinking age. In the [place] the legal drinking age is 53.  If you drink alcohol: ? Limit how much you have to 0-1 drink a day. ? Be aware of how much alcohol is in your drink. In the [place] one drink equals one 12 oz bottle of beer (355 mL), one 5 oz glass of wine (148 mL), or one 1 oz glass of hard liquor (44 mL). Lifestyle  Take daily care of your teeth and gums.  Stay active. Exercise at least 30 minutes 5 or more days of the week.  Do not use any products that contain nicotine or tobacco, such as cigarettes, e-cigarettes, and chewing tobacco. If you need help quitting, ask your health care provider.  Do not use drugs.  If you are sexually active, practice safe sex. Use a condom or other form of birth control (contraception) in order to prevent pregnancy and STIs (sexually transmitted infections). If you plan to become pregnant, see your health care provider for a pre-conception visit.  Find healthy ways to cope with stress, such as: ? Meditation, yoga, or listening to music. ? Journaling. ?  Talking to a trusted person. ? Spending time with friends and family. Safety  Always wear your seat belt while driving or riding in a vehicle.  Do not drive if you have been drinking alcohol. Do not ride with someone who has been drinking.  Do  not drive when you are tired or distracted. Do not text while driving.  Wear a helmet and other protective equipment during sports activities.  If you have firearms in your house, make sure you follow all gun safety procedures.  Seek help if you have been bullied, physically abused, or sexually abused.  Use the Internet responsibly to avoid dangers such as online bullying and online sex predators. What's next?  Go to your health care provider once a year for a well check visit.  Ask your health care provider how often you should have your eyes and teeth checked.  Stay up to date on all vaccines. This information is not intended to replace advice given to you by your health care provider. Make sure you discuss any questions you have with your health care provider. Document Revised: 08/04/2018 Document Reviewed: 08/04/2018 Elsevier Patient Education  2020 [employer].

## 2019-12-17 NOTE — Progress Notes (Signed)
Chief Complaint  Patient presents with  . Annual Exam    fasting annual exam with pap. Could not give UA, will try again. No concerns.     Julia Vasquez is a 19 y.o. female who presents for a complete physical.  She has the following concerns:  At her 09/2018 visit she reported some tachycardia for a couple of months (described as her heart beating very fast, feels it up into her throat.)  It would start abruptly, and occurred about once a week, lasting a few minutes, then spontaneously/suddenly would go back to normal heartbeat.  No associated chest pain or shortness of breath, not lightheaded/dizzy with episodes. Paroxysmal SVT was suspected. Labs were normal (except lipids, see below).  She was supposed to have OV after labs, but she moved, got busy, and never returned; she was going to be referred to cardiology for monitor at her f/u visit. She reports this hasn't changed much--still about once a week. Denies any triggers. Drinks lots of water.  Hand eczema:  Uses Eucrisa when needed for flares (got from derm). This is usually just in the winter, no problems currently.  Migraines:  Gets nausea, light sensitivity (no aura), with headaches, occurs infrequently, maybe once a month.  Often related to too much screen time.  Relieved by 2 Tylenol.  Sleep paralysis--not as often in the past, just up to 2x/month.   Alcohol--a few drinks a month Marijuana--hasn't smoked in a few months.  Uses sporadically. Tobacco use--She is smoking 3 cigarettes/day, mainly when others are smoking, sometimes when stressed.  H/o chlamydia last year. She had a negative test of cure.  She is in a monogamous relationship with the same partner.  Doesn't always use condoms (occasionally doesn't, if they don't have any). On OCP's. Tolerating OCP's without side effects.  No missed doses.  She wears contacts, sees eye doctor yearly.   Hyperlipidemia:  This was noted on screening labs in 2017.  Her mother has  hyperlipidemia and is on statin, and there is FHx of CAD (MGM).   Lipids had improved some with diet, due for recheck. Last year she reported she cut back on sweet tea to 2x/week, eating more chicken and less red meat (2x/week--tacos, spaghetti, lasagna), no longer having much alfredo sauce. +ranch dressing on frequent salads, parmesan cheese. Prior to these changes (when lipids higher, see below), she was eating red meat daily, daily sweet tea, some fried chicken, creamy soups, ranch dressing, alfredo sauces, ice cream, eggs once/week.  Currently her diet: No longer drinks sweet tea, only 1 soda/week. No longer uses ranch dressing. No fast foods. Eats a lot of fruits, vegetables, salads. +eggs, only 1 fried egg twice a week. Not much cheese. +whole milk, 1 cup/day.  Lab Results  Component Value Date   CHOL 236 (H) 10/17/2018   HDL 65 10/17/2018   LDLCALC 134 (H) 10/17/2018   TRIG 185 (H) 10/17/2018   CHOLHDL 3.6 10/17/2018   In 05/2016, TC 268, HDL 59, LDL 162, TG 236, chol/HDL ratio 4.5  She has h/o frequent costochondritis and chest pain. No longer gets any chest pain (just the palpitations as noted above).  Immunization History  Administered Date(s) Administered  . DTaP 01/31/1999, 04/03/1999, 06/20/1999, 06/03/2000, 12/11/2003  . H1N1 06/18/2008, 07/18/2008  . HPV Quadrivalent 03/19/2010, 08/22/2010, 05/04/2013  . Hepatitis A 06/29/2006  . Hepatitis A, Ped/Adol-2 Dose 05/04/2013  . Hepatitis B 01/31/1999, 09/03/1999, 12/07/1999  . HiB (PRP-OMP) 04/03/1999, 06/20/1999, 06/03/2000  . IPV 01/31/1999, 06/20/1999, 09/03/1999,  06/03/2000  . Influenza Whole 06/07/2006  . Influenza,Quad,Nasal, Live 05/31/2013, 07/17/2014  . MMR 12/25/1999, 12/11/2003  . Meningococcal Conjugate 05/04/2013  . Pneumococcal Conjugate-13 01/31/1999, 06/20/1999, 09/03/1999, 06/03/2000  . Tdap 03/19/2010  . Varicella 12/25/1999   +varicella IgG 05/2016  Dentist:twice a year   Ophtho:yearly Exercise: Dances a few hours/day, yoga daily; weights 2x/week, 10#  She has repeatedly declined recommended vaccinations including Menveo#2, Bexsero, flu shots   Vitamin D-OH 33 in 05/2016  PMH, PSH, SH reviewed  Outpatient Encounter Medications as of 12/18/2019  Medication Sig Note  . Multiple Vitamins-Minerals (MULTI-VITAMIN GUMMIES) CHEW Chew 1 tablet by mouth daily.   . [DISCONTINUED] Norgestimate-Ethinyl Estradiol Triphasic (TRI FEMYNOR) 0.18/0.215/0.25 MG-35 MCG tablet Take 1 tablet by mouth daily.   . Norgestimate-Ethinyl Estradiol Triphasic (TRI FEMYNOR) 0.18/0.215/0.25 MG-35 MCG tablet Take 1 tablet by mouth daily.   . [DISCONTINUED] Azelastine-Fluticasone 137-50 MCG/ACT SUSP Place 1 spray into the nose 2 (two) times daily. (Patient not taking: Reported on 09/22/2017) 06/10/2016: Uses prn allergies, not currently  . [DISCONTINUED] azithromycin (ZITHROMAX) 500 MG tablet Take 2 tablets (1,000 mg total) by mouth daily.    No facility-administered encounter medications on file as of 12/18/2019.   Allergies  Allergen Reactions  . Cefuroxime Axetil Diarrhea  . Amoxil [Amoxicillin Trihydrate] Rash    ROS: Deniesfever, chills,URI symptoms or allergies. Denies numbness, tingling, dizziness. Denies heartburn.Normal bowels. Denies urinary complaints, vaginal discharge. Menses are regular on OCP's.No cough, shortness of breath. Palpitations/tachycardia per HPI, intermittently. Denies depresssion, anxiety. Hand eczema in winter, none currently. Headaches, sleep paralysis and tachycardia, per HPI. All unchanged.   PHYSICAL EXAM:  BP 110/62   Pulse 72   Temp 99.1 F (37.3 C) (Other (Comment))   Ht 5' 4"  (1.626 m)   Wt 141 lb (64 kg)   LMP 11/28/2019 (Approximate)   BMI 24.20 kg/m   Wt Readings from Last 3 Encounters:  12/18/19 141 lb (64 kg)  09/28/18 135 lb 12.8 oz (61.6 kg) (63 %, Z= 0.34)*  09/22/17 144 lb 9.6 oz (65.6 kg) (78 %, Z= 0.76)*   * Growth  percentiles are based on CDC (Girls, 2-20 Years) data.    Well developed, pleasant female in no distress.In good spirits HEENT: PERRL, EOMI, conjunctiva clear.TM's and EAC's normal.  Wearing mask due to COVID-19 pandemic (nose and mouth not examined). Neck: supple. No lymphadenopathy, masses or thyromegaly, no carotid bruit. Heart: regular rate and rhythm without murmur or ectopy Lungs: clear bilaterally with good air movement  Chest: nontender to palpation Breasts:No nipple inversion, discharge, skin dimpling, breast masses or axillary lymphadenopathy. Tissue isasymmetric, more fibroglandular on leftupper breast than on right. No dominant masses, nontender. Abdomen: soft, nontender, no organomegaly or mass. Umbilical piercing. Skin: skin on face and back is clear, without any significant acne. GU: normal external genitalia without lesions. BUS/vagina normal. No abnormal discharge noted.  Cervix is normal, no lesions or discharge. No cervical motion tenderness.  Uterus is nontender, not enlarged; no adnexal masses or tenderness.  Pap obtained. Extremities: No edema, normal pulses Neuro: alert and oriented. Normal strength, DTR's 2+. Normal gait  Back: no spinal tenderness, CVA tenderness. No scoliosis  Psych: normal mood, affect, hygiene and grooming.   ASSESSMENT/PLAN:  Annual physical exam - Plan: Cytology - PAP([place])  Mixed hyperlipidemia - counseled re: proper diet.  check lipids today - Plan: Lipid panel  Encounter for surveillance of contraceptive pills - Plan: Norgestimate-Ethinyl Estradiol Triphasic (TRI FEMYNOR) 0.18/0.215/0.25 MG-35 MCG tablet  Tobacco use - cessation  encouraged  Tachycardia - Plan: Ambulatory referral to Cardiology  Palpitations - Plan: Ambulatory referral to Cardiology   Declines full STD check today. 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  Counseled extensively re:Low cholesterol diet,safe sex, substance abuse, tobacco use.Counseled re: sunscreen, seat belt. Handouts given and discussed.

## 2019-12-18 ENCOUNTER — Encounter: Payer: Self-pay | Admitting: Family Medicine

## 2019-12-18 ENCOUNTER — Ambulatory Visit (INDEPENDENT_AMBULATORY_CARE_PROVIDER_SITE_OTHER): Payer: 59 | Admitting: Family Medicine

## 2019-12-18 ENCOUNTER — Other Ambulatory Visit: Payer: Self-pay

## 2019-12-18 ENCOUNTER — Other Ambulatory Visit (HOSPITAL_COMMUNITY)
Admission: RE | Admit: 2019-12-18 | Discharge: 2019-12-18 | Disposition: A | Discharge status: Home / Self Care | Payer: 59 | Source: Ambulatory Visit | Attending: Family Medicine | Admitting: Family Medicine

## 2019-12-18 VITALS — BP 110/62 | HR 72 | Temp 99.1°F | Ht 64.0 in | Wt 141.0 lb

## 2019-12-18 DIAGNOSIS — E782 Mixed hyperlipidemia: Secondary | ICD-10-CM | POA: Diagnosis not present

## 2019-12-18 DIAGNOSIS — Z3041 Encounter for surveillance of contraceptive pills: Secondary | ICD-10-CM

## 2019-12-18 DIAGNOSIS — R000 Tachycardia, unspecified: Secondary | ICD-10-CM | POA: Diagnosis not present

## 2019-12-18 DIAGNOSIS — R002 Palpitations: Secondary | ICD-10-CM | POA: Diagnosis not present

## 2019-12-18 DIAGNOSIS — Z720 Tobacco use: Secondary | ICD-10-CM | POA: Diagnosis not present

## 2019-12-18 DIAGNOSIS — Z0000 Encounter for general adult medical examination without abnormal findings: Secondary | ICD-10-CM

## 2019-12-18 LAB — POCT URINALYSIS DIP (PROADVANTAGE DEVICE)
Bilirubin, UA: NEGATIVE
Blood, UA: NEGATIVE
Glucose, UA: NEGATIVE mg/dL
Ketones, POC UA: NEGATIVE mg/dL
Leukocytes, UA: NEGATIVE
Nitrite, UA: NEGATIVE
Protein Ur, POC: NEGATIVE mg/dL
Specific Gravity, Urine: 1.02
Urobilinogen, Ur: NEGATIVE
pH, UA: 6 (ref 5.0–8.0)

## 2019-12-18 MED ORDER — NORGESTIM-ETH ESTRAD TRIPHASIC 0.18/0.215/0.25 MG-35 MCG PO TABS: 1.0000 | ORAL_TABLET | Freq: Every day | ORAL | 3 refills | Status: DC

## 2019-12-18 NOTE — Addendum Note (Signed)
Addended by: Debbrah Alar F on: 12/18/2019 03:26 PM   Modules accepted: Orders

## 2019-12-19 LAB — LIPID PANEL
Chol/HDL Ratio: 3.8 ratio (ref 0.0–4.4)
Cholesterol, Total: 253 mg/dL — ABNORMAL HIGH (ref 100–199)
HDL: 67 mg/dL (ref >39)
LDL Chol Calc (NIH): 149 mg/dL — ABNORMAL HIGH (ref 0–99)
Triglycerides: 205 mg/dL — ABNORMAL HIGH (ref 0–149)
VLDL Cholesterol Cal: 37 mg/dL (ref 5–40)

## 2019-12-19 NOTE — Addendum Note (Signed)
Addended by: Joselyn Arrow on: 12/19/2019 08:18 AM   Modules accepted: Orders

## 2019-12-20 LAB — CYTOLOGY - PAP
Adequacy: ABSENT
Chlamydia: NEGATIVE
Comment: NEGATIVE
Comment: NORMAL
Diagnosis: NEGATIVE
Neisseria Gonorrhea: NEGATIVE

## 2019-12-21 ENCOUNTER — Encounter: Payer: Self-pay | Admitting: Family Medicine

## 2019-12-29 ENCOUNTER — Encounter: Payer: Self-pay | Admitting: General Practice

## 2019-12-29 ENCOUNTER — Encounter: Payer: Self-pay | Admitting: Family Medicine

## 2020-01-01 ENCOUNTER — Other Ambulatory Visit: Payer: Self-pay | Admitting: Family Medicine

## 2020-01-01 ENCOUNTER — Other Ambulatory Visit: Payer: Self-pay | Admitting: Internal Medicine

## 2020-01-01 DIAGNOSIS — Z3041 Encounter for surveillance of contraceptive pills: Secondary | ICD-10-CM

## 2020-01-01 MED ORDER — NORGESTIM-ETH ESTRAD TRIPHASIC 0.18/0.215/0.25 MG-35 MCG PO TABS: 1.0000 | ORAL_TABLET | Freq: Every day | ORAL | 3 refills | Status: DC

## 2020-01-11 ENCOUNTER — Ambulatory Visit: Payer: 59 | Admitting: Cardiology

## 2020-01-16 NOTE — Progress Notes (Signed)
Cardiology Office Note:    Date:  01/17/2020   ID:  Julia Vasquez, DOB 12-08-2000, MRN [id number]  PCP:  Joselyn Arrow, MD  Cardiologist:  Armanda Magic, MD  Electrophysiologist:  None   Referring MD: Joselyn Arrow, MD   Chief Complaint  Patient presents with  . Palpitations   History of Present Illness:    Julia Vasquez is a 19 y.o. female with a hx of anxiety, depression, tobacco use, hyperlipidemia who is referred by Dr. Lynelle Doctor for evaluation of palpitations.  She reports that she has been having palpitations for 2 years.  Occurs about once per week and lasts for few minutes.  States it feels like heart is racing during episodes.  Can occur at rest.  She denies any lightheadedness, syncope, chest pain, or dyspnea during episodes.  Does report that she gets intermittent chest pain that occurs couple times per month.  Center of her chest, feels like squeezing pain.  Occurs at rest.  No relationship with exertion.  States that she swims 3 times per week for 30 minutes.  Denies any chest pain or dyspnea with this.  Smokes 5 cigarettes/day, smoked for 2 years.  Family history and includes maternal grandmother died of MI at age 25.  Both mother and sister (age 59) have high cholesterol.   Past Medical History:  Diagnosis Date  . Acne vulgaris 05/04/2013  . Acute posttraumatic stress disorder 01/05/2014  . Allergic rhinitis 12/29/2011  . Allergy   . Anxiety   . Chest pain   . Chest pain 07/09/2011  . Chlamydia 09/30/2018  . Costochondritis 08/21/2011  . Depression   . Depression, major, in remission (HCC) 02/26/2015  . Dyshidrotic eczema    hand; sees Dr. Dorita Sciara PA ([place])  . Dysmenorrhea 10/25/2013  . Major depressive disorder, recurrent, severe without psychotic features (HCC)   . Mixed hyperlipidemia 02/07/2017   Lab Results Component Value Date  CHOL 268 (H) 06/10/2016  HDL 59 06/10/2016  LDLCALC 162 (H) 06/10/2016  TRIG 236 (H) 06/10/2016  CHOLHDL 4.5 06/10/2016  (nonfasting);  OV recommended to discuss diet (in 05/2016)  . Severe recurrent major depression without psychotic features (HCC) 06/13/2014  . Shingles 07/2010  . Vision abnormalities    wears contacts    No past surgical history on file.  Current Medications: Current Meds  Medication Sig  . Multiple Vitamins-Minerals (MULTI-VITAMIN GUMMIES) CHEW Chew 1 tablet by mouth daily.  . Norgestimate-Ethinyl Estradiol Triphasic (TRI FEMYNOR) 0.18/0.215/0.25 MG-35 MCG tablet Take 1 tablet by mouth daily.     Allergies:   Cefuroxime axetil and Amoxil [amoxicillin trihydrate]   Social History   Socioeconomic History  . Marital status: Single    Spouse name: Not on file  . Number of children: Not on file  . Years of education: Not on file  . Highest education level: Not on file  Occupational History  . Not on file  Tobacco Use  . Smoking status: Current Every Day Smoker    Types: Cigarettes  . Smokeless tobacco: Never Used  . Tobacco comment: 3-4 cigarettes daily  Substance and Sexual Activity  . Alcohol use: No    Alcohol/week: 0.0 standard drinks    Comment: a few drinks/month (1-2 at a time)  . Drug use: Not Currently    Types: Marijuana    Comment: has occasionally used marijuana  . Sexual activity: Yes    Partners: Male    Birth control/protection: Condom, Pill  Other Topics Concern  . 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 Graduated end of 2017.      Living in a trailer with [employer].  1 dog, 1 cat.  +smokes outside only.      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   Considering a career in teaching in the future.   Social Determinants of Health   Financial Resource Strain:   . Difficulty of Paying Living Expenses:   Food Insecurity:   . Worried About Charity fundraiser in the Last Year:   . Arboriculturist in the Last Year:   Transportation Needs:   . Lexicographer (Medical):   Marland Kitchen Lack of Transportation (Non-Medical):   Physical Activity:   . Days of Exercise per Week:   . Minutes of Exercise per Session:   Stress:   . Feeling of Stress :   Social Connections:   . Frequency of Communication with Friends and Family:   . Frequency of Social Gatherings with Friends and Family:   . Attends Religious Services:   . Active Member of Clubs or Organizations:   . Attends Archivist Meetings:   Marland Kitchen Marital Status:      Family History: The patient's family history includes Arthritis in her father; Asthma in her brother and paternal grandmother; Bipolar disorder in her brother; Cancer in her maternal grandfather; Colon cancer (age of onset: 42) in her maternal grandfather; Diabetes in her maternal aunt, maternal grandfather, and paternal grandfather; Gout in her father; Heart disease in her maternal grandmother; Hyperlipidemia in her mother; Hypertension in her paternal grandmother.  ROS:   Please see the history of present illness.     All other systems reviewed and are negative.  EKGs/Labs/Other Studies Reviewed:    The following studies were reviewed today:   EKG:  EKG is  ordered today.  The ekg ordered today demonstrates normal sinus rhythm, rate 81, no ST/T abnormalities  Recent Labs: No results found for requested labs within last 8760 hours.  Recent Lipid Panel    Component Value Date/Time   CHOL 253 (H) 12/18/2019 1449   TRIG 205 (H) 12/18/2019 1449   HDL 67 12/18/2019 1449   CHOLHDL 3.8 12/18/2019 1449   CHOLHDL 4.5 06/10/2016 1533   VLDL 47 (H) 06/10/2016 1533   LDLCALC 149 (H) 12/18/2019 1449    Physical Exam:    VS:  BP 92/68   Pulse 81   Ht 5\' 3"  (1.6 m)   Wt 143 lb 3.2 oz (65 kg)   LMP 12/18/2019   SpO2 98%   BMI 25.37 kg/m     Wt Readings from Last 3 Encounters:  01/17/20 143 lb 3.2 oz (65 kg)  12/18/19 141 lb (64 kg)  09/28/18 135 lb 12.8 oz (61.6 kg) (63 %, Z= 0.34)*   * Growth percentiles  are based on CDC (Girls, 2-20 Years) data.     GEN: Well nourished, well developed in no acute distress HEENT: Normal NECK: No JVD LYMPHATICS: No lymphadenopathy CARDIAC: RRR, no murmurs, rubs, gallops RESPIRATORY:  Clear to auscultation without rales, wheezing or rhonchi  ABDOMEN: Soft, non-tender, non-distended MUSCULOSKELETAL:  No edema; No deformity  SKIN: Warm and dry NEUROLOGIC:  Alert and oriented x 3 PSYCHIATRIC:  Normal affect   ASSESSMENT:    1. Palpitations   2. Chest pain of uncertain etiology   3. Hyperlipidemia, unspecified hyperlipidemia  type   4. Tobacco use    PLAN:    Palpitations: Description concerning for arrhythmia, will check cardiac monitor x 2 weeks.  We will do Preventice monitor as it is swim proof and she works as a Public relations account executive  Chest pain: Atypical in description.  No relationship with exertion.  Given age, no further cardiac work-up recommended at this time  Hyperlipidemia: LDL 149 on 12/18/2019.  No indication for statin at this time, recommend diet and exercise  Tobacco use: Smokes 5 cigarettes/day x2 years.  Patient counseled on the risks of tobacco use and cessation strongly encouraged  RTC in 3 months   Medication Adjustments/Labs and Tests Ordered: Current medicines are reviewed at length with the patient today.  Concerns regarding medicines are outlined above.  Orders Placed This Encounter  Procedures  . CARDIAC EVENT MONITOR  . EKG 12-Lead   No orders of the defined types were placed in this encounter.   Patient Instructions  Medication Instructions:  Your physician recommends that you continue on your current medications as directed. Please refer to the Current Medication list given to you today.  Testing/Procedures: Your physician has recommended that you wear an event monitor (14 days). Event monitors are medical devices that record the heart's electrical activity. Doctors most often [place] these monitors to diagnose arrhythmias.  Arrhythmias are problems with the speed or rhythm of the heartbeat. The monitor is a small, portable device. You can wear one while you do your normal daily activities. This is usually used to diagnose what is causing palpitations/syncope (passing out).  Follow-Up: At [facility], you and your health needs are our priority.  As part of our continuing mission to provide you with exceptional heart care, we have created designated Provider Care Teams.  These Care Teams include your primary Cardiologist (physician) and Advanced Practice Providers (APPs -  Physician Assistants and Nurse Practitioners) who all work together to provide you with the care you need, when you need it.  We recommend signing up for the patient portal called "MyChart".  Sign up information is provided on this After Visit Summary.  MyChart is used to connect with patients for Virtual Visits (Telemedicine).  Patients are able to view lab/test results, encounter notes, upcoming appointments, etc.  Non-urgent messages can be sent to your provider as well.   To learn more about what you can do with MyChart, go to [email or web address].    Your next appointment:   3 month(s)  The format for your next appointment:   Either In Person or Virtual  Provider:   Epifanio Lesches, MD        Signed, Little Ishikawa, MD  01/17/2020 10:27 AM    [facility]

## 2020-01-17 ENCOUNTER — Ambulatory Visit (INDEPENDENT_AMBULATORY_CARE_PROVIDER_SITE_OTHER): Payer: 59 | Admitting: Cardiology

## 2020-01-17 ENCOUNTER — Telehealth: Payer: Self-pay | Admitting: Radiology

## 2020-01-17 ENCOUNTER — Encounter: Payer: Self-pay | Admitting: Cardiology

## 2020-01-17 ENCOUNTER — Other Ambulatory Visit: Payer: Self-pay

## 2020-01-17 VITALS — BP 92/68 | HR 81 | Ht 63.0 in | Wt 143.2 lb

## 2020-01-17 DIAGNOSIS — Z720 Tobacco use: Secondary | ICD-10-CM

## 2020-01-17 DIAGNOSIS — E785 Hyperlipidemia, unspecified: Secondary | ICD-10-CM

## 2020-01-17 DIAGNOSIS — R002 Palpitations: Secondary | ICD-10-CM

## 2020-01-17 DIAGNOSIS — R079 Chest pain, unspecified: Secondary | ICD-10-CM | POA: Diagnosis not present

## 2020-01-17 NOTE — Patient Instructions (Signed)
Medication Instructions:  Your physician recommends that you continue on your current medications as directed. Please refer to the Current Medication list given to you today.  Testing/Procedures: Your physician has recommended that you wear an event monitor (14 days). Event monitors are medical devices that record the heart's electrical activity. Doctors most often [place] these monitors to diagnose arrhythmias. Arrhythmias are problems with the speed or rhythm of the heartbeat. The monitor is a small, portable device. You can wear one while you do your normal daily activities. This is usually used to diagnose what is causing palpitations/syncope (passing out).  Follow-Up: At [facility], you and your health needs are our priority.  As part of our continuing mission to provide you with exceptional heart care, we have created designated Provider Care Teams.  These Care Teams include your primary Cardiologist (physician) and Advanced Practice Providers (APPs -  Physician Assistants and Nurse Practitioners) who all work together to provide you with the care you need, when you need it.  We recommend signing up for the patient portal called "MyChart".  Sign up information is provided on this After Visit Summary.  MyChart is used to connect with patients for Virtual Visits (Telemedicine).  Patients are able to view lab/test results, encounter notes, upcoming appointments, etc.  Non-urgent messages can be sent to your provider as well.   To learn more about what you can do with MyChart, go to [email or web address].    Your next appointment:   3 month(s)  The format for your next appointment:   Either In Person or Virtual  Provider:   Epifanio Lesches, MD

## 2020-01-17 NOTE — Telephone Encounter (Signed)
Enrolled patient for a 14 day Preventice Event monitor to be mailed to patients home. 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

## 2020-01-30 ENCOUNTER — Telehealth: Payer: Self-pay | Admitting: Cardiology

## 2020-01-30 NOTE — Telephone Encounter (Signed)
Follow Up:    Pt still have not received her monitor.

## 2020-01-30 NOTE — Telephone Encounter (Signed)
New Message:   Mother called and said pt is having fillings put in at her dentist on Thursday. She wants to know if pt is alright to have this or need any pre medication?Marland Kitchen

## 2020-01-31 NOTE — Telephone Encounter (Signed)
Yes she is OK for any dental work needed

## 2020-01-31 NOTE — Telephone Encounter (Signed)
Yes that is fine, she does not need any pre medication

## 2020-01-31 NOTE — Telephone Encounter (Signed)
Spoke with pt mother, aware of dr schumann's recommendations. Spoke with the dentist office and they report they need the okay from dr Bjorn Pippin for local anesthesia, x-rays, pre-med, fillings and crowns. Aware will need to get okay from dr Bjorn Pippin and will fax to [phone].

## 2020-01-31 NOTE — Telephone Encounter (Signed)
Follow Up:   Mother is calling back to find out what Dr Jerene Pitch said. Need this asap, dentist will cx appt if she is not cleared today.

## 2020-01-31 NOTE — Telephone Encounter (Signed)
Spoke to mother (ok per DPR)- aware patient is cleared and will fax to dental office.    Mother request I call dental office to make them aware as they are trying to cancel her procedure tomorrow.     Called office (Dr. Excell Seltzer) at [phone], lmtcb.     Called Dr. Earmon Phoenix office-did not receive fax.  Will re-fax.  They will call when they receive fax and will contact mother to reschedule.      Dr. Randolm Idol office returned call and fax was received.  Nothing further needed

## 2020-02-02 NOTE — Telephone Encounter (Signed)
Monitor was delivered on 6/10

## 2020-02-13 ENCOUNTER — Encounter (INDEPENDENT_AMBULATORY_CARE_PROVIDER_SITE_OTHER): Payer: 59

## 2020-02-13 DIAGNOSIS — R002 Palpitations: Secondary | ICD-10-CM | POA: Diagnosis not present

## 2020-03-25 MED FILL — TRI FEMYNOR 28 TABLET: 0.18/0.215/ | 84 days supply | Qty: 84 | Fill #1

## 2020-04-12 ENCOUNTER — Telehealth: Payer: Self-pay | Admitting: Cardiology

## 2020-04-12 NOTE — Telephone Encounter (Signed)
Spoke with patient's mother per DPR. Results and f/u appt reviewed. Understanding verbalized.

## 2020-04-23 ENCOUNTER — Ambulatory Visit: Payer: 59 | Admitting: Cardiology

## 2020-06-10 MED FILL — TRI FEMYNOR 28 TABLET: 0.18/0.215/ | 84 days supply | Qty: 84 | Fill #2

## 2020-08-29 MED FILL — TRI FEMYNOR 28 TABLET: 0.18/0.215/ | 84 days supply | Qty: 84 | Fill #3

## 2020-11-26 ENCOUNTER — Other Ambulatory Visit: Payer: Self-pay | Admitting: Family Medicine

## 2020-11-26 ENCOUNTER — Other Ambulatory Visit (HOSPITAL_COMMUNITY): Payer: Self-pay

## 2020-11-26 DIAGNOSIS — Z3041 Encounter for surveillance of contraceptive pills: Secondary | ICD-10-CM

## 2020-11-26 NOTE — Telephone Encounter (Signed)
I have many sooner afternoon appointments than 6/23.  Her last CPE was 4/26.  Any reason she can't come sooner?  April 27, May 2, 9, 11, 18, 19  She was prescribed a year supply on 5/10--shouldn't be out yet.

## 2020-11-26 NOTE — Telephone Encounter (Signed)
Patient has appointment scheduled for June 2022. Is it ok to refill this medication before appointment?

## 2020-11-27 ENCOUNTER — Other Ambulatory Visit (HOSPITAL_COMMUNITY): Payer: Self-pay

## 2020-11-27 MED ORDER — NORGESTIM-ETH ESTRAD TRIPHASIC 0.18/0.215/0.25 MG-35 MCG PO TABS
1.0000 | ORAL_TABLET | Freq: Every day | ORAL | 0 refills | Status: DC
  Filled 2020-11-27: qty 84, 84d supply, fill #0

## 2020-11-27 NOTE — Telephone Encounter (Signed)
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 The refill remaining is not enough for 90 days. 90 days sent.

## 2020-11-28 ENCOUNTER — Other Ambulatory Visit (HOSPITAL_COMMUNITY): Payer: Self-pay

## 2020-12-20 ENCOUNTER — Other Ambulatory Visit (HOSPITAL_COMMUNITY): Payer: Self-pay

## 2020-12-20 ENCOUNTER — Encounter: Payer: Self-pay | Admitting: Medical

## 2020-12-20 ENCOUNTER — Telehealth (INDEPENDENT_AMBULATORY_CARE_PROVIDER_SITE_OTHER): Payer: 59 | Admitting: Medical

## 2020-12-20 VITALS — Ht 65.0 in | Wt 140.0 lb

## 2020-12-20 DIAGNOSIS — R059 Cough, unspecified: Secondary | ICD-10-CM

## 2020-12-20 DIAGNOSIS — R110 Nausea: Secondary | ICD-10-CM | POA: Diagnosis not present

## 2020-12-20 DIAGNOSIS — J988 Other specified respiratory disorders: Secondary | ICD-10-CM | POA: Diagnosis not present

## 2020-12-20 MED ORDER — PROMETHAZINE-DM 6.25-15 MG/5ML PO SYRP
5.0000 mL | ORAL_SOLUTION | Freq: Four times a day (QID) | ORAL | 0 refills | Status: DC | PRN
  Filled 2020-12-20: qty 120, 6d supply, fill #0

## 2020-12-20 MED ORDER — EMERGEN-C IMMUNE PLUS PO PACK
1.0000 | PACK | Freq: Two times a day (BID) | ORAL | 0 refills | Status: DC
  Filled 2020-12-20: qty 10, fill #0

## 2020-12-20 NOTE — Progress Notes (Signed)
Subjective:     Patient ID: Julia Vasquez, female   DOB: [birth year], 20 y.o.   MRN: [id number]  This visit type was conducted due to national recommendations for restrictions regarding the COVID-19 Pandemic (e.g. social distancing) in an effort to limit this patient's exposure and mitigate transmission in our community.  Due to their co-morbid illnesses, this patient is at least at moderate risk for complications without adequate follow up.  This format is felt to be most appropriate for this patient at this time.    Documentation for virtual audio and video telecommunications through [facility] encounter:  The patient was located at home. The provider was located in the office. The patient did consent to this visit and is aware of possible charges through their insurance for this visit.  The other persons participating in this telemedicine service were none. Time spent on call was 20 minutes and in review of previous records 20 minutes total.  This virtual service is not related to other E/M service within previous 7 days.   HPI Chief Complaint  Patient presents with  . Vomiting    With cld symptoms that started Sunday night.    Virtual consult for cold symtpoms. Began 5 nights ago with sore throat, cough, stuffy nose, sneezing, some vomiting, body aches, chills, hot flashes.  No sick contacts.  Using some Nyquil cold and flu.  Has had 2 episodes of vomiting today, felt a little better 2 days ago.  hydrating ok.  Feels some sob, some exhausted.  Has not done recent covid test.  Works at [employer].    Has not had the covid vaccine.   No other aggravating or relieving factors. No other complaint.    Past Medical History:  Diagnosis Date  . Acne vulgaris 05/04/2013  . Acute posttraumatic stress disorder 01/05/2014  . Allergic rhinitis 12/29/2011  . Allergy   . Anxiety   . Chest pain   . Chest pain 07/09/2011  . Chlamydia 09/30/2018  . Costochondritis 08/21/2011  . Depression    . Depression, major, in remission (HCC) 02/26/2015  . Dyshidrotic eczema    hand; sees Dr. Dorita Sciara PA ([facility])  . Dysmenorrhea 10/25/2013  . Major depressive disorder, recurrent, severe without psychotic features (HCC)   . Mixed hyperlipidemia 02/07/2017   Lab Results Component Value Date  CHOL 268 (H) 06/10/2016  HDL 59 06/10/2016  LDLCALC 162 (H) 06/10/2016  TRIG 236 (H) 06/10/2016  CHOLHDL 4.5 06/10/2016  (nonfasting); OV recommended to discuss diet (in 05/2016)  . Severe recurrent major depression without psychotic features (HCC) 06/13/2014  . Shingles 07/2010  . Vision abnormalities    wears contacts   Current Outpatient Medications on File Prior to Visit  Medication Sig Dispense Refill  . Multiple Vitamins-Minerals (MULTI-VITAMIN GUMMIES) CHEW Chew 1 tablet by mouth daily.    . Norgestimate-Ethinyl Estradiol Triphasic (TRI FEMYNOR) 0.18/0.215/0.25 MG-35 MCG tablet TAKE 1 TABLET BY MOUTH DAILY. 84 tablet 0   No current facility-administered medications on file prior to visit.   Review of Systems As in subjective    Objective:   Physical Exam Due to coronavirus pandemic stay at home measures, patient visit was virtual and they were not examined in person.   Ht 5\' 5"  (1.651 m)   Wt 140 lb (63.5 kg)   BMI 23.30 kg/m   Gen: wd, wn, nad No dyspnea, no sob or wheezing Answers questions in complete sentences     Assessment:     Encounter Diagnoses  Name  Primary?  . Cough Yes  . Respiratory tract infection        Plan:      Discussed symptoms and concerns.  She is on day 4 of symptoms.  We discussed that her symptoms likely will improve to the weekend.  We discussed quarantine.  We discussed supportive measures.  Prescription as below to help with symptoms.  If much worse over the weekend or not improving then call back/recheck.  She is going to self quarantine over the weekend so she declines flu or COVID testing at this time  General recommendations: I recommend  you rest, hydrate well with water and clear fluids throughout the day.   You can use Tylenol for pain or fever You can use over the counter Delsym for cough. You can use over the counter Emetrol for nausea.     If you are having trouble breathing, if you are very weak, have high fever 103 or higher consistently despite Tylenol, or uncontrollable nausea and vomiting, then call or go to the emergency department.    If you have other questions or have other symptoms or questions you are concerned about then please make a virtual visit  Covid symptoms such as fatigue and cough can linger over 2 weeks, even after the initial fever, aches, chills, and other initial symptoms.   Self Quarantine: The CDC, Centers for Disease Control has recommended a self quarantine of 5 days from the start of your illness until you are symptom-free including at least 24 hours of no symptoms including no fever, no shortness of breath, and no body aches and chills, by day 5 before returning to work or general contact with the public.  What does self quarantine mean: avoiding contact with people as much as possible.   Particularly in your house, isolate your self from others in a separate room, wear a mask when possible in the room, particularly if coughing a lot.   Have others bring food, water, medications, etc., to your door, but avoid direct contact with your household contacts during this time to avoid spreading the infection to them.   If you have a separate bathroom and living quarters during the next 2 weeks away from others, that would be preferable.    If you can't completely isolate, then wear a mask, wash hands frequently with soap and water for at least 15 seconds, minimize close contact with others, and have a friend or family member check regularly from a distance to make sure you are not getting seriously worse.     You should not be going out in public, should not be going to stores, to work or other public  places until all your symptoms have resolved and at least 5 days + 24 hours of no symptoms at all have transpired.   Ideally you should avoid contact with others for a full 5 days if possible.  One of the goals is to limit spread to high risk people; people that are older and elderly, people with multiple health issues like diabetes, heart disease, lung disease, and anybody that has weakened immune systems such as people with cancer or on immunosuppressive therapy.     Brinsley was seen today for vomiting.  Diagnoses and all orders for this visit:  Cough  Respiratory tract infection  Other orders -     Multiple Vitamins-Minerals (EMERGEN-C IMMUNE PLUS) PACK; Take 1 tablet by mouth 2 (two) times daily. -     promethazine-dextromethorphan (PROMETHAZINE-DM) 6.25-15 MG/5ML syrup;  Take 5 mLs by mouth 4 (four) times daily as needed for cough.  f/u prn

## 2021-02-12 NOTE — Progress Notes (Signed)
Chief Complaint  Patient presents with   Annual Exam    Fasting annual exam with pelvic. Sees eyes doctor yearly. Could not give urine sample. No concerns. Did not have flu or covid vaccines. Said maybe on the Tdap since she has to have labs, really too nervous.     Julia Vasquez is a 20 y.o. female who presents for a complete physical.    Last year patient was referred to cardiology for evaluation of episodes of tachycardia.  Had been having episodes about once a week. Heart monitor in July was unremarkable: Predominant rhythm is sinus rhythm. Range is 54-180 bpm with average of 90 bpm. No atrial fibrillation, sustained ventricular tachycardia, significant pause, or high degree AV block. Total ectopy <1%. 2 patient triggered events, corresponding to sinus rhythm +/- PACs. No significant abnormalities.  Today she reports that episodes are not as often, short-lived. Has some chest tenderness sometimes when the heart is racing, otherwise no longer having chest pain as before.  Hand eczema:  Uses Eucrisa when needed for flares (got from derm). Only usually flares on 1 finger (L ring) recently, and easy to improve.  Eczema gets worse in the winter.  Migraines:  These have increased in frequency (last year got just once/month).  She is now getting headaches once a week--light and smell sensitivity, nausea.  Denies any aura.  Gets headache going home from work--she thinks from being in the sun, light reflecting on pool. She only notices this when she works the afternoons. The headaches start on way home after work. Tylenol usually works.  If it doesn't work, goes to sleep.  Sleep paralysis--occurs about 2x/week, is a little scary, often close to waking up in the morning, hard to shake herself out of it.   Alcohol--none in the last year Marijuana--hasn't smoked in a few months.  Uses sporadically. Tobacco use--No longer smokes cigarettes, switched to vaping.  Takes a few hits per hour (on work  breaks, driving).  H/o chlamydia in 2020. She had a negative test of cure.  She is in a monogamous relationship with the same partner.  Doesn't always use condoms (occasionally doesn't, if they don't have any). On OCP's.  Denies breakthrough bleeding, nausea or side effects. Rare missed pills--takes in morning if she forgets it at night.  Hyperlipidemia:  This was noted on screening labs in 2017.  Her mother has hyperlipidemia and is on statin, and there is FHx of CAD (MGM).   Lipids had improved some with diet Current diet: Not drinking any sweet tea, only 1 soda/week. She drinks a lot of juice (watermelon, pineapple and grape). No longer uses ranch dressing. Eats a lot of fruits, vegetables, salads. No longer eating cheese, no much cheese (on burgers, just some grated parm on salads). Eats fast food 2x/week--burgers, fries, chicken tenders  Lab Results  Component Value Date   CHOL 253 (H) 12/18/2019   CHOL 236 (H) 10/17/2018   CHOL 268 (H) 06/10/2016   Lab Results  Component Value Date   HDL 67 12/18/2019   HDL 65 10/17/2018   HDL 59 06/10/2016   Lab Results  Component Value Date   LDLCALC 149 (H) 12/18/2019   LDLCALC 134 (H) 10/17/2018   LDLCALC 162 (H) 06/10/2016   Lab Results  Component Value Date   TRIG 205 (H) 12/18/2019   TRIG 185 (H) 10/17/2018   TRIG 236 (H) 06/10/2016   Lab Results  Component Value Date   CHOLHDL 3.8 12/18/2019   CHOLHDL  3.6 10/17/2018   CHOLHDL 4.5 06/10/2016     Immunization History  Administered Date(s) Administered   DTaP 01/31/1999, 04/03/1999, 06/20/1999, 06/03/2000, 12/11/2003   H1N1 06/18/2008, 07/18/2008   HPV Quadrivalent 03/19/2010, 08/22/2010, 05/04/2013   Hepatitis A 06/29/2006   Hepatitis A, Ped/Adol-2 Dose 05/04/2013   Hepatitis B 01/31/1999, 09/03/1999, 12/07/1999   HiB (PRP-OMP) 04/03/1999, 06/20/1999, 06/03/2000   IPV 01/31/1999, 06/20/1999, 09/03/1999, 06/03/2000   Influenza Whole 06/07/2006    Influenza,Quad,Nasal, Live 05/31/2013, 07/17/2014   MMR 12/25/1999, 12/11/2003   Meningococcal Conjugate 05/04/2013   Pneumococcal Conjugate-13 01/31/1999, 06/20/1999, 09/03/1999, 06/03/2000   Tdap 03/19/2010   Varicella 12/25/1999   She has repeatedly declined recommended vaccinations including Menveo#2, Bexsero, flu shots  Declines COVID vaccines. Had [place] in 11/2020. +varicella IgG 05/2016 Pap smear: 11/2019, normal, negative Chlamydia and GC. Dentist: twice a year  Ophtho: yearly Exercise: Swim daily in the summers.  Still dances at home, 2x/week (1 hour); does yoga a few times/week;  no longer using weights.   Vitamin D-OH 33 in 05/2016  PMH, PSH, SH reviewed  Outpatient Encounter Medications as of 02/13/2021  Medication Sig   Multiple Vitamins-Minerals (MULTI-VITAMIN GUMMIES) CHEW Chew 1 tablet by mouth daily.   Norgestimate-Ethinyl Estradiol Triphasic (TRI FEMYNOR) 0.18/0.215/0.25 MG-35 MCG tablet TAKE 1 TABLET BY MOUTH DAILY.   [DISCONTINUED] Multiple Vitamins-Minerals (EMERGEN-C IMMUNE PLUS) PACK Take 1 tablet by mouth 2 (two) times daily.   [DISCONTINUED] promethazine-dextromethorphan (PROMETHAZINE-DM) 6.25-15 MG/5ML syrup Take 5 mLs by mouth 4 (four) times daily as needed for cough.   No facility-administered encounter medications on file as of 02/13/2021.   Tylenol prn headaches  Allergies  Allergen Reactions   Cefuroxime Axetil Diarrhea   Amoxil [Amoxicillin Trihydrate] Rash    ROS:  Denies fever, chills, URI symptoms or allergies. Denies numbness, tingling, dizziness. Denies heartburn.  Normal bowels. Denies urinary complaints, vaginal discharge.  Menses are regular on OCP's. No cough, shortness of breath. Palpitations/tachycardia per HPI, intermittently, less often Denies depresssion, anxiety. Hand eczema, mainly in winter. Headaches, sleep paralysis and tachycardia, per HPI.  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 In good spirits HEENT: PERRL, EOMI, conjunctiva clear. TM's and EAC's normal.  Wearing mask due to COVID-19 pandemic (nose and mouth not examined). Neck: supple. No lymphadenopathy, masses or thyromegaly, no carotid bruit. Heart: regular rate and rhythm without murmur or ectopy Lungs: clear bilaterally with good air movement   Chest: nontender to palpation Breasts: No nipple inversion, discharge, skin dimpling, breast masses, tenderness or axillary lymphadenopathy.  Abdomen: soft, nontender, no organomegaly or mass. Umbilical piercing. Skin: skin on face and back is clear, without any significant acne. No suspicious lesions.  Skin on hands are normal (no flare of eczema currently) GU: Not examined today, lack of any complaints/concerns Extremities: No edema, normal pulses Neuro: alert and oriented. Normal strength, DTR's 2+. Normal gait   Back: no spinal tenderness, CVA tenderness. No scoliosis   Psych: normal mood, affect, hygiene and grooming.   ASSESSMENT/PLAN:  Annual physical exam - Plan: CBC with Differential/Platelet,  Lipid panel, Hepatitis C antibody, GC/Chlamydia Probe Amp, POCT Urinalysis DIP (Proadvantage Device)  Mixed hyperlipidemia - counseled re: lowfat, low cholesterol diet.  Cut back on juice, eat more fruit - Plan: Lipid panel  Migraine without aura and without status migrainosus, not intractable - more frequent--likely related to heat, dehydration, glare of sun. r/b Tylenol. No aura, so no contraindication to cont OCP's  Need for Tdap vaccination - Plan: Tdap vaccine greater than or equal to 7yo IM  Need for hepatitis C screening test - Plan: Hepatitis C  antibody  Current every day nicotine vaping - counseled re: risks, encouraged complete cessation  Encounter for surveillance of contraceptive pills - Plan: Norgestimate-Ethinyl Estradiol Triphasic (TRI FEMYNOR) 0.18/0.215/0.25 MG-35 MCG tablet  Declines COVID vaccines, flu shots Tdap given today Counseled re: vaping, safe sex, low cholesterol diet, substance abuse, tobacco use, proper sunscreen, seatbelt use, smoke and carbon monoxide detectors.   Lipid, CBC, Hep C, GC and chlamydia. Declines HIV  F/u 1 year, sooner prn

## 2021-02-12 NOTE — Patient Instructions (Addendum)
Try and cut back some on the juices, eat more fruit instead. Consider gatorade when working.  Try and stay well hydrated to avoid migraines.   Well Child Safety, Young Adult This sheet provides general safety recommendations. Talk with a health careprovider if you have any questions. Home safety Make sure your home or apartment has smoke detectors and carbon monoxide detectors. Test them once a month. Change their batteries every year. If you keep guns and ammunition in the home, make sure they are stored separately and locked away. Make your home a tobacco-free and drug-free environment. Motor vehicle safety  Wear a seat belt whenever you drive or ride in a vehicle. Do not text, talk, or use your phone or other mobile devices while driving. Do not drive when you are tired. If you feel like you may fall asleep while driving, pull over at a safe location and take a break or switch drivers. Do not drive after drinking or using drugs. Plan for a designated driver or another way to go home. Do not ride in a car with someone who has been using drugs or alcohol. Do not ride in the bed or cargo area of a pickup truck. Sun safety  Use broad-spectrum sunscreen that protects against UVA and UVB radiation (SPF 15 or higher). Put on sunscreen 15-30 minutes before going outside. Reapply sunscreen every 2 hours, or more often if you get wet or if you are sweating. Use enough sunscreen to cover all exposed areas. Rub it in well. Wear sunglasses when you are out in the sun. Do not use tanning beds. Tanning beds are just as harmful for your skin as the sun. Water safety Never swim alone. Only swim in designated areas. Do not swim in areas where you do not know the water conditions or where underwater hazards are located. Personal safety Do not use tobacco, drugs, anabolic steroids, or diet pills. Do not drink or use drugs while swimming, boating, riding a bike or motorcycle, or using heavy  machinery. Do not drink heavily (binge drink). Your brain is still developing, and alcohol can affect your brain development. Wear protective gear for sports and other physical activities, such as a helmet, mouth guard, eye protection, wrist guards, elbow pads, and knee pads. Wear a helmet when biking, riding a motorcycle or all-terrain vehicle (ATV), skateboarding, skiing, or snowboarding. If you are sexually active, practice safe sex. Use a condom or other form of birth control (contraception) in order to prevent pregnancy and STIs (sexually transmitted infections). If you do not wish to become pregnant, use a form of birth control. If you plan to become pregnant, see your health care provider for a preconception visit. Avoid risky situations or situations where you do not feel safe. Call for help if you find yourself in an unsafe situation. Neverleave a party or event alone without telling a friend that you are leaving. Never leave with a stranger. Neveraccept a drink from a stranger if you do not know where the drink came from. Do not misuse medicines. This means that you should not take a medicine other than how it is prescribed and you should not take someone else's medicine. Learn to manage conflict without using violence. Avoid people who suggest unsafe or harmful behavior, and avoid unhealthy romantic relationships or friendships where you do not feel respected. No one has the right to pressure you into any activity that makes you feel uncomfortable. If others make you feel unsafe, you can: Ask for help  from your parents or guardians, your health care provider, or other trusted adults like a Runner, broadcasting/film/video, coach, or counselor. Call the [employer] Violence Hotline at (867)201-4259 or go online: www.thehotline.org General instructions Protect your hearing and avoid exposure to loud music or noises by: Wearing ear protection when you are in a noisy environment (while using loud machinery, like  a lawn mower, or at concerts). Making sure that the volume is not too loud when listening to music in the car or through headphones. Avoid tattoos and body piercings. Tattoos and body piercings can get infected. Where to find more information: American Academy of Pediatrics: www.healthychildren.org Centers for Disease Control and Prevention: FootballExhibition.com.br Summary Protect yourself from sun exposure by using broad-spectrum sunscreen that protects against UVA and UVB radiation (SPF 15 or higher). Wear appropriate protective gear when playing sports and doing other activities. Gear may include a helmet, mouth guard, eye protection, wrist guards, and elbow and knee pads. Be safe when driving or riding in vehicles. While driving: Wear a seat belt. Do not use your mobile device. Do not drink or use drugs. Always be aware of your surroundings. Avoid risky situations or places where you feel unsafe. Avoid relationships or friendships in which you do not feel respected. It is okay to ask for help from your parents or guardians, your health care provider, or other trusted adults like a Runner, broadcasting/film/video, coach, or counselor. This information is not intended to replace advice given to you by your health care provider. Make sure you discuss any questions you have with your healthcare provider. Document Revised: 07/26/2020 Document Reviewed: 07/26/2020 Elsevier Patient Education  2022 [employer].

## 2021-02-13 ENCOUNTER — Encounter: Payer: 59 | Admitting: Family Medicine

## 2021-02-13 ENCOUNTER — Encounter: Payer: Self-pay | Admitting: Family Medicine

## 2021-02-13 ENCOUNTER — Ambulatory Visit (INDEPENDENT_AMBULATORY_CARE_PROVIDER_SITE_OTHER): Payer: 59 | Admitting: Family Medicine

## 2021-02-13 ENCOUNTER — Other Ambulatory Visit (HOSPITAL_COMMUNITY): Payer: Self-pay

## 2021-02-13 VITALS — BP 100/70 | HR 80 | Ht 64.5 in | Wt 143.4 lb

## 2021-02-13 DIAGNOSIS — Z3041 Encounter for surveillance of contraceptive pills: Secondary | ICD-10-CM | POA: Diagnosis not present

## 2021-02-13 DIAGNOSIS — G43009 Migraine without aura, not intractable, without status migrainosus: Secondary | ICD-10-CM

## 2021-02-13 DIAGNOSIS — Z720 Tobacco use: Secondary | ICD-10-CM | POA: Diagnosis not present

## 2021-02-13 DIAGNOSIS — E782 Mixed hyperlipidemia: Secondary | ICD-10-CM | POA: Diagnosis not present

## 2021-02-13 DIAGNOSIS — Z1159 Encounter for screening for other viral diseases: Secondary | ICD-10-CM

## 2021-02-13 DIAGNOSIS — Z23 Encounter for immunization: Secondary | ICD-10-CM | POA: Diagnosis not present

## 2021-02-13 DIAGNOSIS — Z0000 Encounter for general adult medical examination without abnormal findings: Secondary | ICD-10-CM

## 2021-02-13 LAB — POCT URINALYSIS DIP (PROADVANTAGE DEVICE)
Bilirubin, UA: NEGATIVE
Blood, UA: NEGATIVE
Glucose, UA: NEGATIVE mg/dL
Ketones, POC UA: NEGATIVE mg/dL
Leukocytes, UA: NEGATIVE
Nitrite, UA: NEGATIVE
Protein Ur, POC: NEGATIVE mg/dL
Specific Gravity, Urine: 1.025
Urobilinogen, Ur: NEGATIVE
pH, UA: 5.5 (ref 5.0–8.0)

## 2021-02-13 MED ORDER — NORGESTIM-ETH ESTRAD TRIPHASIC 0.18/0.215/0.25 MG-35 MCG PO TABS
1.0000 | ORAL_TABLET | Freq: Every day | ORAL | 3 refills | Status: DC
  Filled 2021-02-13: qty 84, 84d supply, fill #0
  Filled 2021-05-19: qty 84, 84d supply, fill #1
  Filled 2021-07-22: qty 84, 84d supply, fill #2
  Filled 2021-10-29: qty 84, 84d supply, fill #3

## 2021-02-14 LAB — CBC WITH DIFFERENTIAL/PLATELET
Basophils Absolute: 0.1 10*3/uL (ref 0.0–0.2)
Basos: 1 %
EOS (ABSOLUTE): 0.1 10*3/uL (ref 0.0–0.4)
Eos: 1 %
Hematocrit: 38.6 % (ref 34.0–46.6)
Hemoglobin: 13.2 g/dL (ref 11.1–15.9)
Immature Grans (Abs): 0 10*3/uL (ref 0.0–0.1)
Immature Granulocytes: 0 %
Lymphocytes Absolute: 2.8 10*3/uL (ref 0.7–3.1)
Lymphs: 23 %
MCH: 30.6 pg (ref 26.6–33.0)
MCHC: 34.2 g/dL (ref 31.5–35.7)
MCV: 89 fL (ref 79–97)
Monocytes Absolute: 0.6 10*3/uL (ref 0.1–0.9)
Monocytes: 5 %
Neutrophils Absolute: 8.2 10*3/uL — ABNORMAL HIGH (ref 1.4–7.0)
Neutrophils: 70 %
Platelets: 300 10*3/uL (ref 150–450)
RBC: 4.32 x10E6/uL (ref 3.77–5.28)
RDW: 11.8 % (ref 11.7–15.4)
WBC: 11.8 10*3/uL — ABNORMAL HIGH (ref 3.4–10.8)

## 2021-02-14 LAB — LIPID PANEL
Chol/HDL Ratio: 4 ratio (ref 0.0–4.4)
Cholesterol, Total: 242 mg/dL — ABNORMAL HIGH (ref 100–199)
HDL: 61 mg/dL (ref >39)
LDL Chol Calc (NIH): 148 mg/dL — ABNORMAL HIGH (ref 0–99)
Triglycerides: 183 mg/dL — ABNORMAL HIGH (ref 0–149)
VLDL Cholesterol Cal: 33 mg/dL (ref 5–40)

## 2021-02-14 LAB — HEPATITIS C ANTIBODY: Hep C Virus Ab: <0.1 s/co ratio (ref 0.0–0.9)

## 2021-02-15 LAB — GC/CHLAMYDIA PROBE AMP
Chlamydia trachomatis, NAA: NEGATIVE
Neisseria Gonorrhoeae by PCR: NEGATIVE

## 2021-02-19 ENCOUNTER — Other Ambulatory Visit (HOSPITAL_COMMUNITY): Payer: Self-pay

## 2021-05-19 ENCOUNTER — Other Ambulatory Visit (HOSPITAL_COMMUNITY): Payer: Self-pay

## 2021-06-19 ENCOUNTER — Other Ambulatory Visit (HOSPITAL_COMMUNITY): Payer: Self-pay

## 2021-06-19 MED ORDER — SODIUM FLUORIDE 0.2 % MT SOLN
OROMUCOSAL | 5 refills | Status: DC
  Filled 2021-06-19: qty 473, 30d supply, fill #0

## 2021-07-22 ENCOUNTER — Other Ambulatory Visit (HOSPITAL_COMMUNITY): Payer: Self-pay

## 2021-07-22 DIAGNOSIS — H5213 Myopia, bilateral: Secondary | ICD-10-CM | POA: Diagnosis not present

## 2021-07-25 ENCOUNTER — Other Ambulatory Visit (HOSPITAL_COMMUNITY): Payer: Self-pay

## 2021-09-19 ENCOUNTER — Other Ambulatory Visit (HOSPITAL_COMMUNITY): Payer: Self-pay

## 2021-09-30 ENCOUNTER — Other Ambulatory Visit (HOSPITAL_COMMUNITY): Payer: Self-pay

## 2021-10-22 ENCOUNTER — Other Ambulatory Visit (HOSPITAL_COMMUNITY): Payer: Self-pay

## 2021-10-29 ENCOUNTER — Other Ambulatory Visit (HOSPITAL_COMMUNITY): Payer: Self-pay

## 2021-12-16 ENCOUNTER — Telehealth: Payer: Self-pay

## 2021-12-16 NOTE — Telephone Encounter (Signed)
Called patient to reschedule appointment with Verneita Griffes on 12/17/21. Left message for patient to call office. ?

## 2021-12-17 ENCOUNTER — Ambulatory Visit: Payer: 59 | Admitting: Nurse Practitioner

## 2021-12-17 ENCOUNTER — Ambulatory Visit: Payer: 59 | Admitting: Physician Assistant

## 2021-12-17 ENCOUNTER — Encounter: Payer: Self-pay | Admitting: Nurse Practitioner

## 2021-12-17 ENCOUNTER — Other Ambulatory Visit (HOSPITAL_COMMUNITY): Payer: Self-pay

## 2021-12-17 VITALS — BP 116/60 | HR 73 | Ht 65.0 in | Wt 145.0 lb

## 2021-12-17 DIAGNOSIS — R002 Palpitations: Secondary | ICD-10-CM

## 2021-12-17 DIAGNOSIS — Z720 Tobacco use: Secondary | ICD-10-CM

## 2021-12-17 DIAGNOSIS — E782 Mixed hyperlipidemia: Secondary | ICD-10-CM | POA: Diagnosis not present

## 2021-12-17 MED ORDER — PROPRANOLOL HCL 10 MG PO TABS
10.0000 mg | ORAL_TABLET | Freq: Four times a day (QID) | ORAL | 1 refills | Status: DC | PRN
  Filled 2021-12-17: qty 120, 30d supply, fill #0

## 2021-12-17 NOTE — Patient Instructions (Addendum)
Medication Instructions:  ?Propranolol 10 mg every 6 hours as needed for palpitations ?.   ?*If you need a refill on your cardiac medications before your next appointment, please call your pharmacy* ? ? ?Lab Work: ?NONE ordered at this time of appointment  ?  ?If you have labs (blood work) drawn today and your tests are completely normal, you will receive your results only by: ?MyChart Message (if you have MyChart) OR ?A paper copy in the mail ?If you have any lab test that is abnormal or we need to change your treatment, we will call you to review the results. ? ? ?Testing/Procedures: ?NONE ordered at this time of appointment  ? ? ? ?Follow-Up: ?At [facility], you and your health needs are our priority.  As part of our continuing mission to provide you with exceptional heart care, we have created designated Provider Care Teams.  These Care Teams include your primary Cardiologist (physician) and Advanced Practice Providers (APPs -  Physician Assistants and Nurse Practitioners) who all work together to provide you with the care you need, when you need it. ? ?We recommend signing up for the patient portal called "MyChart".  Sign up information is provided on this After Visit Summary.  MyChart is used to connect with patients for Virtual Visits (Telemedicine).  Patients are able to view lab/test results, encounter notes, upcoming appointments, etc.  Non-urgent messages can be sent to your provider as well.   ?To learn more about what you can do with MyChart, go to [email or web address].   ? ?Your next appointment:   ?2-3 month(s) ? ?The format for your next appointment:   ?In Person ? ?Provider:   ?Fransico Him, MD  or   ?Diona Browner, NP      ? ? ?Other Instructions ? ? ?Important Information About Sugar ? ? ? ? ? ? ?

## 2021-12-17 NOTE — Progress Notes (Signed)
? ? ?Office Visit  ?  ?Patient Name: Julia Vasquez ?Date of Encounter: 12/17/2021 ? ?Primary Care Provider:  Joselyn Arrow, MD ?Primary Cardiologist:  Armanda Magic, MD ? ?Chief Complaint  ?  ?21 year old female with a history of palpitations, hyperlipidemia, tobacco use, anxiety and depression who presents for follow-up related to palpitations. ? ?Past Medical History  ?  ?Past Medical History:  ?Diagnosis Date  ? Acne vulgaris 05/04/2013  ? Acute posttraumatic stress disorder 01/05/2014  ? Allergic rhinitis 12/29/2011  ? Allergy   ? Anxiety   ? Chest pain   ? Chest pain 07/09/2011  ? Chlamydia 09/30/2018  ? Costochondritis 08/21/2011  ? Depression   ? Depression, major, in remission (HCC) 02/26/2015  ? Dyshidrotic eczema   ? hand; sees Dr. Dorita Sciara PA ([facility] [place])  ? Dysmenorrhea 10/25/2013  ? Major depressive disorder, recurrent, severe without psychotic features (HCC)   ? Mixed hyperlipidemia 02/07/2017  ? Lab Results Component Value Date  CHOL 268 (H) 06/10/2016  HDL 59 06/10/2016  LDLCALC 162 (H) 06/10/2016  TRIG 236 (H) 06/10/2016  CHOLHDL 4.5 06/10/2016  (nonfasting); OV recommended to discuss diet (in 05/2016)  ? Severe recurrent major depression without psychotic features (HCC) 06/13/2014  ? Shingles 07/2010  ? Vision abnormalities   ? wears contacts  ? ?No past surgical history on file. ? ?Allergies ? ?Allergies  ?Allergen Reactions  ? Cefuroxime Axetil Diarrhea  ? Amoxil [Amoxicillin Trihydrate] Rash  ? ? ?History of Present Illness  ?  ?21 year old female with the above past medical history including palpitations, hyperlipidemia, tobacco use, anxiety and depression. ? ?She saw Dr. Bjorn Pippin in May 2021 in the setting of palpitations.  She reported reported a 2-year history of palpitations, which included episodes of her heart racing for minutes at a time, occurring once per week.  She denied any other associated symptoms, however, she did report intermittent atypical nonexertional chest discomfort, which she  described as a squeezing pain, and occurred a few times a month.  Outpatient cardiac monitor in July 2021 showed no evidence of arrhythmia, occasional PACs.  She has not been seen in follow-up since. ? ?She presents today for follow-up companied by her mother.  Since her last visit she continues to have palpitations that occur once or twice a month.  She describes the sensation as her heart is racing.  She states that overall, her symptoms are unchanged since the time of her heart monitor in 2021. In most cases, her symptoms last for minutes at a time, however, she did have an episode last week during which her symptoms lasted for over an hour.  She felt lightheaded, short of breath, flushed, unsteady, and reported a tightness in her neck.  Her symptoms resolved spontaneously.  She denies presyncope, syncope., denies chest pain. Other than her palpitations, she denies any additional concerns today. ? ?Home Medications  ?  ?Current Outpatient Medications  ?Medication Sig Dispense Refill  ? Multiple Vitamins-Minerals (MULTI-VITAMIN GUMMIES) CHEW Chew 1 tablet by mouth daily.    ? Norgestimate-Ethinyl Estradiol Triphasic (TRI FEMYNOR) 0.18/0.215/0.25 MG-35 MCG tablet Take 1 tablet by mouth daily. 84 tablet 3  ? SODIUM FLUORIDE, DENTAL RINSE, (PREVIDENT) 0.2 % SOLN USE AS AN ORAL RINSE, AS DIRECTED ON PACKAGE 473 mL 5  ? ?No current facility-administered medications for this visit.  ?  ? ?Review of Systems  ?  ?She denies chest pain, dyspnea, pnd, orthopnea, n, v, syncope, edema, weight gain, or early satiety. All other systems reviewed and are  otherwise negative except as noted above.  ? ?Physical Exam  ?  ?VS:  BP 116/60   Pulse 73   Ht 5\' 5"  (1.651 m)   Wt 145 lb (65.8 kg)   SpO2 97%   BMI 24.13 kg/m?   ?GEN: Well nourished, well developed, in no acute distress. ?HEENT: normal. ?Neck: Supple, no JVD, carotid bruits, or masses. ?Cardiac: RRR, no murmurs, rubs, or gallops. No clubbing, cyanosis, edema.   Radials/DP/PT 2+ and equal bilaterally.  ?Respiratory:  Respirations regular and unlabored, clear to auscultation bilaterally. ?GI: Soft, nontender, nondistended, BS + x 4. ?MS: no deformity or atrophy. ?Skin: warm and dry, no rash. ?Neuro:  Strength and sensation are intact. ?Psych: Normal affect. ? ?Accessory Clinical Findings  ?  ?ECG personally reviewed by me today -NSR with sinus arrhythmia, 73 bpm- no acute changes. ? ?Lab Results  ?Component Value Date  ? WBC 11.8 (H) 02/13/2021  ? HGB 13.2 02/13/2021  ? HCT 38.6 02/13/2021  ? MCV 89 02/13/2021  ? PLT 300 02/13/2021  ? ?Lab Results  ?Component Value Date  ? CREATININE 0.76 10/17/2018  ? BUN 9 10/17/2018  ? NA 140 10/17/2018  ? K 4.3 10/17/2018  ? CL 102 10/17/2018  ? CO2 20 10/17/2018  ? ?Lab Results  ?Component Value Date  ? ALT 10 10/17/2018  ? AST 16 10/17/2018  ? ALKPHOS 73 10/17/2018  ? BILITOT 0.3 10/17/2018  ? ?Lab Results  ?Component Value Date  ? CHOL 242 (H) 02/13/2021  ? HDL 61 02/13/2021  ? LDLCALC 148 (H) 02/13/2021  ? TRIG 183 (H) 02/13/2021  ? CHOLHDL 4.0 02/13/2021  ?  ?No results found for: HGBA1C ? ?Assessment & Plan  ? ?1. Palpitations:  Monitor in July 2021 showed no evidence of arrhythmia, occasional PACs.  For the most part, her symptoms are unchanged since that time.  However, she did have a recent episode during which her symptoms lasted for over an hour, she also felt lightheaded, short of breath, flushed, unsteady, and reported tightness in her neck.  Denies presyncope, syncope.  Symptoms concerning for possible SVT, however, given lack of frequency of symptoms, I do not think a ZIO patch would be revealing at this time.  If symptoms become more frequent, could consider 30-day Preventice monitor. Discussed possible use of Kardia mobile device for at home monitoring.  Discussed vagal maneuvers, ED precautions.  Will add propanolol 10 mg every 6 hours as needed for palpitations.  Continue to monitor.   ? ?2. Hyperlipidemia: LDL was 148  in June 2022. Monitored and  managed per PCP. No indication for statin at this time. Encouraged ongoing lifestyle modifications with diet and exercise. ? ?3. Tobacco use: She states she does not smoke cigarettes but vapes regularly.  Full cessation advised. ? ?4. Disposition: Follow-up in 2 months. ? ?July 2022, NP ?12/17/2021, 2:27 PM ?  ?

## 2021-12-29 ENCOUNTER — Other Ambulatory Visit (HOSPITAL_COMMUNITY): Payer: Self-pay

## 2022-01-16 ENCOUNTER — Other Ambulatory Visit (HOSPITAL_COMMUNITY): Payer: Self-pay

## 2022-01-16 ENCOUNTER — Other Ambulatory Visit: Payer: Self-pay | Admitting: Family Medicine

## 2022-01-16 DIAGNOSIS — Z3041 Encounter for surveillance of contraceptive pills: Secondary | ICD-10-CM

## 2022-01-16 MED ORDER — NORGESTIM-ETH ESTRAD TRIPHASIC 0.18/0.215/0.25 MG-35 MCG PO TABS
1.0000 | ORAL_TABLET | Freq: Every day | ORAL | 0 refills | Status: DC
  Filled 2022-01-16: qty 84, 84d supply, fill #0

## 2022-02-11 NOTE — Progress Notes (Deleted)
Office Visit    Patient Name: Julia Vasquez Date of Encounter: 02/11/2022  Primary Care Provider:  Joselyn Arrow, MD Primary Cardiologist:  Armanda Magic, MD  Chief Complaint    21 year old female with a history of palpitations, hyperlipidemia, tobacco use, anxiety and depression who presents for follow-up related to palpitations.  Past Medical History    Past Medical History:  Diagnosis Date   Acne vulgaris 05/04/2013   Acute posttraumatic stress disorder 01/05/2014   Allergic rhinitis 12/29/2011   Allergy    Anxiety    Chest pain    Chest pain 07/09/2011   Chlamydia 09/30/2018   Costochondritis 08/21/2011   Depression    Depression, major, in remission (HCC) 02/26/2015   Dyshidrotic eczema    hand; sees Dr. Dorita Sciara PA ([facility] [place])   Dysmenorrhea 10/25/2013   Major depressive disorder, recurrent, severe without psychotic features (HCC)    Mixed hyperlipidemia 02/07/2017   Lab Results Component Value Date  CHOL 268 (H) 06/10/2016  HDL 59 06/10/2016  LDLCALC 162 (H) 06/10/2016  TRIG 236 (H) 06/10/2016  CHOLHDL 4.5 06/10/2016  (nonfasting); OV recommended to discuss diet (in 05/2016)   Severe recurrent major depression without psychotic features (HCC) 06/13/2014   Shingles 07/2010   Vision abnormalities    wears contacts   No past surgical history on file.  Allergies  Allergies  Allergen Reactions   Cefuroxime Axetil Diarrhea   Amoxil [Amoxicillin Trihydrate] Rash    History of Present Illness    21 year old female with the above past medical history including palpitations, hyperlipidemia, tobacco use, anxiety and depression.   She saw Dr. Bjorn Pippin in May 2021 in the setting of palpitations.  She reported reported a 2-year history of palpitations, which included episodes of her heart racing for minutes at a time, occurring once per week.  She denied any other associated symptoms, however, she did report intermittent atypical nonexertional chest discomfort, which she  described as a squeezing pain, and occurred a few times a month.  Outpatient cardiac monitor in July 2021 showed no evidence of arrhythmia, occasional PACs.  She was last seen in the office on 12/17/2021 and reported ongoing palpitations that occur once or twice a month.  Did report an isolated episode  during which her symptoms lasted for over an hour. She felt lightheaded, short of breath, flushed, unsteady, and reported a tightness in her neck.  Her symptoms resolved spontaneously. She was started on propanolol 10 mg every 6 hours as needed.    She presents today for follow-up.  Since her last visit  1. Palpitations:  Monitor in July 2021 showed no evidence of arrhythmia, occasional PACs.  For the most part, her symptoms are unchanged since that time.  At her visit in April 2023 she reported ongoing though infrequent palpitations.  Symptoms concerning for possible SVT, however, given lack of frequency of symptoms, I do not think a ZIO patch would be revealing at this time.  If symptoms become more frequent, could consider 30-day Preventice monitor. Discussed possible use of Kardia mobile device for at home monitoring.  Sheas started on propanolol as needed.  Discussed vagal maneuvers, ED precautions.     2. Hyperlipidemia: LDL was 148 in June 2022. Monitored and  managed per PCP. No indication for statin at this time. Encouraged ongoing lifestyle modifications with diet and exercise.   3. Tobacco use: She states she does not smoke cigarettes but vapes regularly.  Full cessation advised.   4. Disposition: Follow-up in  Home Medications    Current Outpatient Medications  Medication Sig Dispense Refill   Multiple Vitamins-Minerals (MULTI-VITAMIN GUMMIES) CHEW Chew 1 tablet by mouth daily.     Norgestimate-Ethinyl Estradiol Triphasic (TRI FEMYNOR) 0.18/0.215/0.25 MG-35 MCG tablet Take 1 tablet by mouth daily. 84 tablet 0   propranolol (INDERAL) 10 MG tablet Take 1 tablet (10 mg total) by mouth 4  (four) times daily as needed for palpitations. 120 tablet 1   SODIUM FLUORIDE, DENTAL RINSE, (PREVIDENT) 0.2 % SOLN USE AS AN ORAL RINSE, AS DIRECTED ON PACKAGE 473 mL 5   No current facility-administered medications for this visit.     Review of Systems    ***.  All other systems reviewed and are otherwise negative except as noted above.    Physical Exam    VS:  There were no vitals taken for this visit. , BMI There is no height or weight on file to calculate BMI.     GEN: Well nourished, well developed, in no acute distress. HEENT: normal. Neck: Supple, no JVD, carotid bruits, or masses. Cardiac: RRR, no murmurs, rubs, or gallops. No clubbing, cyanosis, edema.  Radials/DP/PT 2+ and equal bilaterally.  Respiratory:  Respirations regular and unlabored, clear to auscultation bilaterally. GI: Soft, nontender, nondistended, BS + x 4. MS: no deformity or atrophy. Skin: warm and dry, no rash. Neuro:  Strength and sensation are intact. Psych: Normal affect.  Accessory Clinical Findings    ECG personally reviewed by me today - *** - no acute changes.  Lab Results  Component Value Date   WBC 11.8 (H) 02/13/2021   HGB 13.2 02/13/2021   HCT 38.6 02/13/2021   MCV 89 02/13/2021   PLT 300 02/13/2021   Lab Results  Component Value Date   CREATININE 0.76 10/17/2018   BUN 9 10/17/2018   NA 140 10/17/2018   K 4.3 10/17/2018   CL 102 10/17/2018   CO2 20 10/17/2018   Lab Results  Component Value Date   ALT 10 10/17/2018   AST 16 10/17/2018   ALKPHOS 73 10/17/2018   BILITOT 0.3 10/17/2018   Lab Results  Component Value Date   CHOL 242 (H) 02/13/2021   HDL 61 02/13/2021   LDLCALC 148 (H) 02/13/2021   TRIG 183 (H) 02/13/2021   CHOLHDL 4.0 02/13/2021    No results found for: "HGBA1C"  Assessment & Plan    1.  ***   Joylene Grapes, NP 02/11/2022, 12:35 PM

## 2022-02-12 ENCOUNTER — Ambulatory Visit: Payer: 59 | Admitting: Nurse Practitioner

## 2022-02-12 DIAGNOSIS — Z720 Tobacco use: Secondary | ICD-10-CM

## 2022-02-12 DIAGNOSIS — E782 Mixed hyperlipidemia: Secondary | ICD-10-CM

## 2022-02-12 DIAGNOSIS — R002 Palpitations: Secondary | ICD-10-CM

## 2022-02-18 ENCOUNTER — Encounter: Payer: Self-pay | Admitting: Nurse Practitioner

## 2022-04-14 NOTE — Patient Instructions (Incomplete)
  HEALTH MAINTENANCE RECOMMENDATIONS:  It is recommended that you get at least 30 minutes of aerobic exercise at least 5 days/week (for weight loss, you may need as much as 60-90 minutes). This can be any activity that gets your heart rate up. This can be divided in 10-15 minute intervals if needed, but try and build up your endurance at least once a week.  Weight bearing exercise is also recommended twice weekly.  Eat a healthy diet with lots of vegetables, fruits and fiber.  "Colorful" foods have a lot of vitamins (ie green vegetables, tomatoes, red peppers, etc).  Limit sweet tea, regular sodas and alcoholic beverages, all of which has a lot of calories and sugar.  Up to 1 alcoholic drink daily may be beneficial for women (unless trying to lose weight, watch sugars).  Drink a lot of water.  Calcium recommendations are 1200-1500 mg daily (1500 mg for postmenopausal women or women without ovaries), and vitamin D 1000 IU daily.  This should be obtained from diet and/or supplements (vitamins), and calcium should not be taken all at once, but in divided doses.  Monthly self breast exams and yearly mammograms for women over the age of 97 is recommended.  Sunscreen of at least SPF 30 should be used on all sun-exposed parts of the skin when outside between the hours of 10 am and 4 pm (not just when at beach or pool, but even with exercise, golf, tennis, and yard work!)  Use a sunscreen that says "broad spectrum" so it covers both UVA and UVB rays, and make sure to reapply every 1-2 hours.  Remember to change the batteries in your smoke detectors when changing your clock times in the spring and fall. Carbon monoxide detectors are recommended for your home.  Use your seat belt every time you are in a car, and please drive safely and not be distracted with cell phones and texting while driving.  I recommend yearly flu shots. I recommend getting COVID vaccination--an updated vaccine will be available next  month.  I encourage you to stop vaping.  Try and limit sugary beverages (regular soda, juices). Limit fries, fried foods, greasy foods.

## 2022-04-14 NOTE — Progress Notes (Unsigned)
No chief complaint on file.   Julia Vasquez is a 21 y.o. female who presents for a complete physical.    Palpitations--She continues to have episodes of palpitations.  Prior heart monitor was unremarkable (some PAC's noted).  Episodes are generally a few times per month, short-lived.  She did have an episode that lasted over an hour, with associated dizziness and shortness of breath. She saw cardiologist again in April. She was prescribed propranolol 10mg  to use prn.    Hand eczema:  Uses Eucrisa when needed for flares (got from derm). Only usually flares on 1 finger (L ring) recently, and easy to improve.  Eczema gets worse in the winter.  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  She would get them on her way home after work, and were relieved by Tylenol (and if that didn't work, going to sleep would.)  Alcohol--none in the last year Marijuana--hasn't smoked in a few months.  Uses sporadically. Tobacco use--No longer smokes cigarettes, switched to vaping.  Takes a few hits per hour (on work breaks, driving).  H/o chlamydia in 2020. She had a negative test of cure.  She is in a monogamous relationship with the same partner.  Doesn't always use condoms (occasionally doesn't, if they don't have any). On OCP's.  Denies breakthrough bleeding, nausea or side effects. Rare missed pills--takes in morning if she forgets it at night.  Hyperlipidemia:  This was noted on screening labs in 2017.  Her mother has hyperlipidemia and is on statin, and there is FHx of CAD (MGM).   Lipids had improved some with diet Current diet: Not drinking any sweet tea, only 1 soda/week. She drinks a lot of juice (watermelon, pineapple and grape). No longer uses ranch dressing. Eats a lot of fruits, vegetables, salads. Not much cheese (on burgers, just some grated parm on salads). Eats fast food 2x/week--burgers,  fries, chicken tenders  Lab Results  Component Value Date   CHOL 242 (H) 02/13/2021   CHOL 253 (H) 12/18/2019   CHOL 236 (H) 10/17/2018   Lab Results  Component Value Date   HDL 61 02/13/2021   HDL 67 12/18/2019   HDL 65 10/17/2018   Lab Results  Component Value Date   LDLCALC 148 (H) 02/13/2021   LDLCALC 149 (H) 12/18/2019   LDLCALC 134 (H) 10/17/2018   Lab Results  Component Value Date   TRIG 183 (H) 02/13/2021   TRIG 205 (H) 12/18/2019   TRIG 185 (H) 10/17/2018   Lab Results  Component Value Date   CHOLHDL 4.0 02/13/2021   CHOLHDL 3.8 12/18/2019   CHOLHDL 3.6 10/17/2018     Immunization History  Administered Date(s) Administered   DTaP 01/31/1999, 04/03/1999, 06/20/1999, 06/03/2000, 12/11/2003   H1N1 06/18/2008, 07/18/2008   HPV Quadrivalent 03/19/2010, 08/22/2010, 05/04/2013   Hepatitis A 06/29/2006   Hepatitis A, Ped/Adol-2 Dose 05/04/2013   Hepatitis B 01/31/1999, 09/03/1999, 12/07/1999   HiB (PRP-OMP) 04/03/1999, 06/20/1999, 06/03/2000   IPV 01/31/1999, 06/20/1999, 09/03/1999, 06/03/2000   Influenza Whole 06/07/2006   Influenza,Quad,Nasal, Live 05/31/2013, 07/17/2014   MMR 12/25/1999, 12/11/2003   Meningococcal Conjugate 05/04/2013   Pneumococcal Conjugate-13 01/31/1999, 06/20/1999, 09/03/1999, 06/03/2000   Tdap 03/19/2010, 02/13/2021   Varicella 12/25/1999   She has declined recommended vaccinations including Menveo#2, Bexsero, flu shots and COVID vaccines. +varicella IgG 05/2016 Pap smear: 11/2019, normal, negative Chlamydia and GC. Dentist: twice a year  Ophtho: yearly Exercise: Swim daily  in the summers.  Still dances at home, 2x/week (1 hour); does yoga a few times/week;  no longer using weights.   Vitamin D-OH 33 in 05/2016  PMH, PSH, SH reviewed     ROS:  Denies fever, chills, URI symptoms or allergies. Denies numbness, tingling, dizziness. Denies heartburn.  Normal bowels. Denies urinary complaints, vaginal discharge. Menses are regular  on OCP's. No cough, shortness of breath.  Palpitations/tachycardia per HPI. Denies depression, anxiety. Hand eczema, mainly in winter. Headaches per HPI Sleep paralysis--occurs about 2x/week (often close to waking up in the morning)    PHYSICAL EXAM:  There were no vitals taken for this visit.  Wt Readings from Last 3 Encounters:  12/17/21 145 lb (65.8 kg)  02/13/21 143 lb 6.4 oz (65 kg)  12/20/20 140 lb (63.5 kg)    Well developed, pleasant female in no distress. In good spirits HEENT: PERRL, EOMI, conjunctiva clear. TM's and EAC's normal. No sinus tenderness. OP clear Neck: supple. No lymphadenopathy, masses or thyromegaly, no carotid bruit. Heart: regular rate and rhythm without murmur or ectopy Lungs: clear bilaterally with good air movement   Chest: nontender to palpation Breasts: No nipple inversion, discharge, skin dimpling, breast masses, tenderness or axillary lymphadenopathy.  Abdomen: soft, nontender, no organomegaly or mass. Umbilical piercing. Skin: skin on face and back is clear, without any significant acne. No suspicious lesions.  Skin on hands are normal (no flare of eczema currently) GU: Not examined today, lack of any complaints/concerns Extremities: No edema, normal pulses Neuro: alert and oriented. Normal strength, DTR's 2+. Normal gait   Back: no spinal tenderness, CVA tenderness. No scoliosis   Psych: normal mood, affect, hygiene and grooming.   ASSESSMENT/PLAN:  Depression screens on all physicals (PHQ-2) Need urine sample for GC/chlamydia testing.  Ok to get at end if going to delay being ready when I'm ready to see her Offer flu shot    Counseled re: vaping, safe sex, low cholesterol diet, substance abuse, tobacco/vaping use, proper sunscreen, seatbelt use, smoke and carbon monoxide detectors.  lipids GC and chlamydia. TSH and CBC if sx, more frequent palps  RF OCP's  F/u 1 year, sooner prn

## 2022-04-15 ENCOUNTER — Ambulatory Visit (INDEPENDENT_AMBULATORY_CARE_PROVIDER_SITE_OTHER): Payer: 59 | Admitting: Family Medicine

## 2022-04-15 ENCOUNTER — Encounter: Payer: Self-pay | Admitting: Family Medicine

## 2022-04-15 ENCOUNTER — Other Ambulatory Visit (HOSPITAL_COMMUNITY): Payer: Self-pay

## 2022-04-15 VITALS — BP 110/60 | HR 88 | Ht 64.0 in | Wt 148.8 lb

## 2022-04-15 DIAGNOSIS — Z3041 Encounter for surveillance of contraceptive pills: Secondary | ICD-10-CM

## 2022-04-15 DIAGNOSIS — E782 Mixed hyperlipidemia: Secondary | ICD-10-CM

## 2022-04-15 DIAGNOSIS — Z0000 Encounter for general adult medical examination without abnormal findings: Secondary | ICD-10-CM

## 2022-04-15 DIAGNOSIS — G43009 Migraine without aura, not intractable, without status migrainosus: Secondary | ICD-10-CM

## 2022-04-15 MED ORDER — NORGESTIM-ETH ESTRAD TRIPHASIC 0.18/0.215/0.25 MG-35 MCG PO TABS
1.0000 | ORAL_TABLET | Freq: Every day | ORAL | 3 refills | Status: DC
  Filled 2022-04-15: qty 84, 84d supply, fill #0
  Filled 2022-07-13: qty 84, 84d supply, fill #1
  Filled 2022-09-23: qty 84, 84d supply, fill #2
  Filled 2022-12-03: qty 84, 84d supply, fill #3

## 2022-04-16 LAB — LIPID PANEL
Chol/HDL Ratio: 3.9 ratio (ref 0.0–4.4)
Cholesterol, Total: 256 mg/dL — ABNORMAL HIGH (ref 100–199)
HDL: 66 mg/dL (ref >39)
LDL Chol Calc (NIH): 170 mg/dL — ABNORMAL HIGH (ref 0–99)
Triglycerides: 116 mg/dL (ref 0–149)
VLDL Cholesterol Cal: 20 mg/dL (ref 5–40)

## 2022-04-17 ENCOUNTER — Other Ambulatory Visit (HOSPITAL_COMMUNITY): Payer: Self-pay

## 2022-04-17 LAB — GC/CHLAMYDIA PROBE AMP
Chlamydia trachomatis, NAA: NEGATIVE
Neisseria Gonorrhoeae by PCR: NEGATIVE

## 2022-04-29 ENCOUNTER — Encounter: Payer: Self-pay | Admitting: Internal Medicine

## 2022-06-02 ENCOUNTER — Encounter: Payer: Self-pay | Admitting: Internal Medicine

## 2022-06-10 ENCOUNTER — Encounter: Payer: Self-pay | Admitting: Family Medicine

## 2022-06-10 NOTE — Telephone Encounter (Signed)
Needs visit to discuss meds. (Risks/side effects, monitoring, risks with pregnancy, etc).

## 2022-06-19 ENCOUNTER — Ambulatory Visit (INDEPENDENT_AMBULATORY_CARE_PROVIDER_SITE_OTHER): Payer: 59 | Admitting: Medical

## 2022-06-19 ENCOUNTER — Encounter: Payer: Self-pay | Admitting: Medical

## 2022-06-19 ENCOUNTER — Other Ambulatory Visit (HOSPITAL_COMMUNITY): Payer: Self-pay

## 2022-06-19 VITALS — Temp 98.4°F | Wt 140.0 lb

## 2022-06-19 DIAGNOSIS — R52 Pain, unspecified: Secondary | ICD-10-CM | POA: Diagnosis not present

## 2022-06-19 DIAGNOSIS — R197 Diarrhea, unspecified: Secondary | ICD-10-CM | POA: Diagnosis not present

## 2022-06-19 DIAGNOSIS — Z20818 Contact with and (suspected) exposure to other bacterial communicable diseases: Secondary | ICD-10-CM

## 2022-06-19 DIAGNOSIS — J029 Acute pharyngitis, unspecified: Secondary | ICD-10-CM | POA: Diagnosis not present

## 2022-06-19 LAB — POCT INFLUENZA A/B
Influenza A, POC: NEGATIVE
Influenza B, POC: NEGATIVE

## 2022-06-19 LAB — POCT RAPID STREP A (OFFICE): Rapid Strep A Screen: NEGATIVE

## 2022-06-19 LAB — POC COVID19 BINAXNOW: SARS Coronavirus 2 Ag: NEGATIVE

## 2022-06-19 MED ORDER — AZITHROMYCIN 250 MG PO TABS
ORAL_TABLET | ORAL | 0 refills | Status: AC
  Filled 2022-06-19: qty 6, 5d supply, fill #0

## 2022-06-19 MED ORDER — ONDANSETRON 4 MG PO TBDP
4.0000 mg | ORAL_TABLET | Freq: Three times a day (TID) | ORAL | 0 refills | Status: DC | PRN
  Filled 2022-06-19: qty 20, 7d supply, fill #0

## 2022-06-19 NOTE — Progress Notes (Signed)
Subjective:     Patient ID: Julia Vasquez, female   DOB: [birth year], 21 y.o.   MRN: [id number]  This visit type was conducted due to national recommendations for restrictions regarding the COVID-19 Pandemic (e.g. social distancing) in an effort to limit this patient's exposure and mitigate transmission in our community.  Due to their co-morbid illnesses, this patient is at least at moderate risk for complications without adequate follow up.  This format is felt to be most appropriate for this patient at this time.    Documentation for virtual audio and video telecommunications through [facility] encounter:  The patient was located at home. The provider was located in the office. The patient did consent to this visit and is aware of possible charges through their insurance for this visit.  The other persons participating in this telemedicine service were none. Time spent on call was 20 minutes and in review of previous records 20 minutes total.  This virtual service is not related to other E/M service within previous 7 days.   HPI Chief Complaint  Patient presents with   flu like symptoms    Flu like symptoms- Sore throat, body aches, vomiting nauseas, congestion, cough, diarrhea, HA x 2 days ago. Covid negative this morning. Pt was going to do in person visit but doesn't feel like it   Virutal for flu like symptoms.  She notes sore throat, body aches, headaches, congestion, lots of cough.  No fever.  Bad sore throat.  Nausea, had 3 episodes of vomiting.  Having several loose stools.  No blood in stool.   Has been around some sick contacts at work positive for strep.  No mono exposure, no covid exposure.  No swollen glands.  Using mucinex and motrin for symptoms.  No other aggravating or relieving factors. No other complaint.  Past Medical History:  Diagnosis Date   Acne vulgaris 05/04/2013   Acute posttraumatic stress disorder 01/05/2014   Allergic rhinitis 12/29/2011   Allergy     Anxiety    Chest pain    Chest pain 07/09/2011   Chlamydia 09/30/2018   Costochondritis 08/21/2011   Depression    Depression, major, in remission ([place]) 02/26/2015   Dyshidrotic eczema    hand; sees Dr. Ledell Peoples PA ([facility])   Dysmenorrhea 10/25/2013   Major depressive disorder, recurrent, severe without psychotic features ([place])    Mixed hyperlipidemia 02/07/2017   Lab Results Component Value Date  CHOL 268 (H) 06/10/2016  HDL 59 06/10/2016  LDLCALC 162 (H) 06/10/2016  TRIG 236 (H) 06/10/2016  CHOLHDL 4.5 06/10/2016  (nonfasting); OV recommended to discuss diet (in 05/2016)   Severe recurrent major depression without psychotic features ([place]) 06/13/2014   Shingles 07/2010   Vision abnormalities    wears contacts   Current Outpatient Medications on File Prior to Visit  Medication Sig Dispense Refill   Multiple Vitamins-Minerals (MULTI-VITAMIN GUMMIES) CHEW Chew 1 tablet by mouth daily.     Norgestimate-Ethinyl Estradiol Triphasic (TRI FEMYNOR) 0.18/0.215/0.25 MG-35 MCG tablet Take 1 tablet by mouth daily. 84 tablet 3   propranolol (INDERAL) 10 MG tablet Take 1 tablet (10 mg total) by mouth 4 (four) times daily as needed for palpitations. 120 tablet 1   SODIUM FLUORIDE, DENTAL RINSE, (PREVIDENT) 0.2 % SOLN USE AS AN ORAL RINSE, AS DIRECTED ON PACKAGE 473 mL 5   No current facility-administered medications on file prior to visit.     Review of Systems As in subjective    Objective:   Physical Exam  Due to coronavirus pandemic stay at home measures, patient visit was virtual and they were not examined in person.   Temp 98.4 F (36.9 C)   Wt 140 lb (63.5 kg)   BMI 24.03 kg/m   Gen: wd, wn, nad Mildly ill appearing No witnessed dyspnea      Assessment:     Encounter Diagnoses  Name Primary?   Sore throat Yes   Body aches    Diarrhea, unspecified type        Plan:     We discussed symptoms and concerns.  Swab testing in our office parking lot today was negative for  strep COVID and flu.  She has had some strep exposures in close proximity.  We discussed that her symptoms sound more like a viral syndrome particular given the cough and congestion.  We discussed strep symptoms including sore throat, headache, swollen glands, fever and lack of cough.  We also discussed mono and other potential illnesses.  Advised rest, hydration throughout the weekend, can continue ibuprofen for fever and not feeling well.  Salt water gargles and warm fluids for throat pain.  Begin Zofran as needed for nausea.  If symptoms shift over the weekend primarily less cough and the onset of fever and swollen glands in the throat then she could consider Z-Pak antibiotic for symptoms of strep exposure.  Otherwise I would treat this as a virus which would not require antibiotics  Follow-up as needed   Julia Vasquez was seen today for flu like symptoms.  Diagnoses and all orders for this visit:  Sore throat -     Influenza A/B -     Cancel: Pfizer Fall 2023 Covid-19 Vaccine 10yrs and older -     Rapid Strep A -     POC COVID-19  Body aches -     Influenza A/B -     Cancel: Pfizer Fall 2023 Covid-19 Vaccine 81yrs and older -     Rapid Strep A -     POC COVID-19  Diarrhea, unspecified type -     Influenza A/B -     Cancel: Pfizer Fall 2023 Covid-19 Vaccine 78yrs and older -     Rapid Strep A -     POC COVID-19  Other orders -     azithromycin (ZITHROMAX) 250 MG tablet; 2 tablets day 1, then 1 tablet days 2-4 -     ondansetron (ZOFRAN-ODT) 4 MG disintegrating tablet; Take 1 tablet (4 mg total) by mouth every 8 (eight) hours as needed for nausea or vomiting.  F/u prn

## 2022-06-19 NOTE — Patient Instructions (Signed)
We discussed symptoms and concerns.  Swab testing in our office parking lot today was negative for strep COVID and flu.  She has had some strep exposures in close proximity.  We discussed that her symptoms sound more like a viral syndrome particular given the cough and congestion.  We discussed strep symptoms including sore throat, headache, swollen glands, fever and lack of cough.  We also discussed mono and other potential illnesses.  Advised rest, hydration throughout the weekend, can continue ibuprofen for fever and not feeling well.  Salt water gargles and warm fluids for throat pain.  Begin Zofran as needed for nausea.  If symptoms shift over the weekend primarily less cough and the onset of fever and swollen glands in the throat then she could consider Z-Pak antibiotic for symptoms of strep exposure.  Otherwise I would treat this as a virus which would not require antibiotics  Follow-up as needed

## 2022-06-22 ENCOUNTER — Ambulatory Visit: Payer: 59 | Admitting: Medical

## 2022-06-22 ENCOUNTER — Other Ambulatory Visit (HOSPITAL_COMMUNITY): Payer: Self-pay

## 2022-06-22 VITALS — BP 110/70 | HR 89 | Temp 98.6°F | Wt 144.2 lb

## 2022-06-22 DIAGNOSIS — J029 Acute pharyngitis, unspecified: Secondary | ICD-10-CM | POA: Diagnosis not present

## 2022-06-22 LAB — POCT MONO (EPSTEIN BARR VIRUS): Mono, POC: NEGATIVE

## 2022-06-22 LAB — POCT RAPID STREP A (OFFICE): Rapid Strep A Screen: NEGATIVE

## 2022-06-22 MED ORDER — EMERGEN-C IMMUNE PLUS PO PACK
1.0000 | PACK | Freq: Two times a day (BID) | ORAL | 0 refills | Status: DC
  Filled 2022-06-22: qty 10, 5d supply, fill #0

## 2022-06-22 NOTE — Progress Notes (Signed)
Subjective:     Patient ID: Julia Vasquez, female   DOB: [birth year], 21 y.o.   MRN: [id number]  Chief Complaint  Patient presents with   Sore Throat    Sore throat, coughing, congestion.    Here for ongoing sore throat.  We did a virtual visit last week for the same.  She has been using the treatment recommendations and did start the antibiotic because of strep exposure but she does not feel a lot better.  She has ongoing same symptoms, worse symptoms and sore throat.  She notes sore throat, body aches, headaches, congestion, lots of cough.  No fever.  Bad sore throat.  Nausea, had 3 episodes of vomiting initially but none since last week's virtual visit.  Having several loose stools.  No blood in stool.   Has been around some sick contacts at work positive for strep.  No mono exposure, no covid exposure.  No swollen glands.  Using mucinex and motrin for symptoms.  No other aggravating or relieving factors. No other complaint.  Past Medical History:  Diagnosis Date   Acne vulgaris 05/04/2013   Acute posttraumatic stress disorder 01/05/2014   Allergic rhinitis 12/29/2011   Allergy    Anxiety    Chest pain    Chest pain 07/09/2011   Chlamydia 09/30/2018   Costochondritis 08/21/2011   Depression    Depression, major, in remission ([place]) 02/26/2015   Dyshidrotic eczema    hand; sees Dr. Ledell Peoples PA ([facility])   Dysmenorrhea 10/25/2013   Major depressive disorder, recurrent, severe without psychotic features ([place])    Mixed hyperlipidemia 02/07/2017   Lab Results Component Value Date  CHOL 268 (H) 06/10/2016  HDL 59 06/10/2016  LDLCALC 162 (H) 06/10/2016  TRIG 236 (H) 06/10/2016  CHOLHDL 4.5 06/10/2016  (nonfasting); OV recommended to discuss diet (in 05/2016)   Severe recurrent major depression without psychotic features ([place]) 06/13/2014   Shingles 07/2010   Vision abnormalities    wears contacts   Current Outpatient Medications on File Prior to Visit  Medication Sig Dispense Refill    azithromycin (ZITHROMAX) 250 MG tablet Take 2 tablets (500 mg total) by mouth daily for 1 day, THEN 1 tablet (250 mg total) daily for 4 days. 6 tablet 0   Multiple Vitamins-Minerals (MULTI-VITAMIN GUMMIES) CHEW Chew 1 tablet by mouth daily.     Norgestimate-Ethinyl Estradiol Triphasic (TRI FEMYNOR) 0.18/0.215/0.25 MG-35 MCG tablet Take 1 tablet by mouth daily. 84 tablet 3   ondansetron (ZOFRAN-ODT) 4 MG disintegrating tablet Take 1 tablet (4 mg total) by mouth every 8 (eight) hours as needed for nausea or vomiting. 20 tablet 0   propranolol (INDERAL) 10 MG tablet Take 1 tablet (10 mg total) by mouth 4 (four) times daily as needed for palpitations. 120 tablet 1   SODIUM FLUORIDE, DENTAL RINSE, (PREVIDENT) 0.2 % SOLN USE AS AN ORAL RINSE, AS DIRECTED ON PACKAGE 473 mL 5   No current facility-administered medications on file prior to visit.     Review of Systems As in subjective    Objective:   Physical Exam Due to coronavirus pandemic stay at home measures, patient visit was virtual and they were not examined in person.   BP 110/70   Pulse 89   Temp 98.6 F (37 C)   Wt 144 lb 3.2 oz (65.4 kg)   SpO2 98%   BMI 24.75 kg/m   Gen: wd, wn, nad TMs pearly, nares with mild erythema, pharynx with mild erythema and cobblestoning on the  posterior pharynx, tonsils mildly enlarged, no exudate Neck supple with tender shotty anterior nodes, no thyromegaly  lungs clear       Assessment:     Encounter Diagnoses  Name Primary?   Sore throat Yes   Viral pharyngitis        Plan:     We discussed symptoms and concerns.  Negative for strep and mono today.  Last week when she came in the back parking lot she was negative for strep COVID and flu.  Symptoms still suggest viral pharyngitis.  She is on about day 4 of 5 of symptoms.  We discussed typically this type of illness would last up to 7 to 10 days.  So the worst is probably behind her.  Continue supportive measures, rest, hydration  throughout the weekend, can continue ibuprofen up to 3 times a day for fever and not feeling well.  Salt water gargles and warm fluids for throat pain.  Use Zofran as needed for nausea.  Finish out the Z-Pak since she started this.  Follow-up as needed   [name] was seen today for sore throat.  Diagnoses and all orders for this visit:  Sore throat -     Mono (Epstein Barr Virus) -     POCT rapid strep A  Viral pharyngitis  Other orders -     Multiple Vitamins-Minerals (EMERGEN-C IMMUNE PLUS) PACK; Take 1 tablet by mouth 2 (two) times daily.   F/u prn

## 2022-07-13 ENCOUNTER — Other Ambulatory Visit (HOSPITAL_COMMUNITY): Payer: Self-pay

## 2022-08-04 ENCOUNTER — Other Ambulatory Visit (HOSPITAL_COMMUNITY): Payer: Self-pay

## 2022-08-04 ENCOUNTER — Encounter: Payer: Self-pay | Admitting: Medical

## 2022-08-04 ENCOUNTER — Ambulatory Visit (INDEPENDENT_AMBULATORY_CARE_PROVIDER_SITE_OTHER): Payer: 59 | Admitting: Medical

## 2022-08-04 VITALS — Temp 98.4°F | Ht 65.0 in | Wt 140.0 lb

## 2022-08-04 DIAGNOSIS — J101 Influenza due to other identified influenza virus with other respiratory manifestations: Secondary | ICD-10-CM | POA: Diagnosis not present

## 2022-08-04 MED ORDER — OSELTAMIVIR PHOSPHATE 75 MG PO CAPS
75.0000 mg | ORAL_CAPSULE | Freq: Two times a day (BID) | ORAL | 0 refills | Status: DC
  Filled 2022-08-04: qty 10, 5d supply, fill #0

## 2022-08-04 NOTE — Progress Notes (Signed)
Subjective:     Patient ID: Julia Vasquez, female   DOB: 05-22-2001, 21 y.o.   MRN: [id number]  This visit type was conducted due to national recommendations for restrictions regarding the COVID-19 Pandemic (e.g. social distancing) in an effort to limit this patient's exposure and mitigate transmission in our community.  Due to their co-morbid illnesses, this patient is at least at moderate risk for complications without adequate follow up.  This format is felt to be most appropriate for this patient at this time.    Documentation for virtual audio and video telecommunications through [facility] encounter:  The patient was located at home. The provider was located in the office. The patient did consent to this visit and is aware of possible charges through their insurance for this visit.  The other persons participating in this telemedicine service were none. Time spent on call was 20 minutes and in review of previous records 20 minutes total.  This virtual service is not related to other E/M service within previous 7 days.   HPI Chief Complaint  Patient presents with   Cough    VIRTUAL, cough, body aches and chills, fever, HA and loose stools. Symptoms started Sunday night. Home covid/flu test done yesterday and was negative. Highest temp was 101.4 yesterday.   Virtual consult for illness.  He notes abrupt onset of feeling bad a day and a half ago.  She reports quite a bit of cough, body aches, chills, fever, headache, loose stool, some sore throat but not bad, head congestion, runny nose and congestion.  She vomited once from coughing.  Some nausea.  She felt slightly sore back yesterday.  No sick contacts.  She had a home flu/COVID test that her mother brought home from the office and the flu test shows a faint line for flu a today.  COVID was negative.  No other aggravating or relieving factors. No other complaint.  Past Medical History:  Diagnosis Date   Acne vulgaris 05/04/2013    Acute posttraumatic stress disorder 01/05/2014   Allergic rhinitis 12/29/2011   Allergy    Anxiety    Chest pain    Chest pain 07/09/2011   Chlamydia 09/30/2018   Costochondritis 08/21/2011   Depression    Depression, major, in remission ([facility]) 02/26/2015   Dyshidrotic eczema    hand; sees Dr. Dorita Sciara PA ([facility])   Dysmenorrhea 10/25/2013   Major depressive disorder, recurrent, severe without psychotic features ([facility])    Mixed hyperlipidemia 02/07/2017   Lab Results Component Value Date  CHOL 268 (H) 06/10/2016  HDL 59 06/10/2016  LDLCALC 162 (H) 06/10/2016  TRIG 236 (H) 06/10/2016  CHOLHDL 4.5 06/10/2016  (nonfasting); OV recommended to discuss diet (in 05/2016)   Severe recurrent major depression without psychotic features ([facility]) 06/13/2014   Shingles 07/2010   Vision abnormalities    wears contacts   Current Outpatient Medications on File Prior to Visit  Medication Sig Dispense Refill   dextromethorphan-guaiFENesin (MUCINEX DM) 30-600 MG 12hr tablet Take 1 tablet by mouth 2 (two) times daily.     ibuprofen (ADVIL) 800 MG tablet Take 800 mg by mouth every 8 (eight) hours as needed.     Multiple Vitamins-Minerals (MULTI-VITAMIN GUMMIES) CHEW Chew 1 tablet by mouth daily.     Norgestimate-Ethinyl Estradiol Triphasic (TRI FEMYNOR) 0.18/0.215/0.25 MG-35 MCG tablet Take 1 tablet by mouth daily. 84 tablet 3   ondansetron (ZOFRAN-ODT) 4 MG disintegrating tablet Take 1 tablet (4 mg total) by mouth every 8 (eight) hours as  needed for nausea or vomiting. (Patient not taking: Reported on 08/04/2022) 20 tablet 0   propranolol (INDERAL) 10 MG tablet Take 1 tablet (10 mg total) by mouth 4 (four) times daily as needed for palpitations. (Patient not taking: Reported on 08/04/2022) 120 tablet 1   No current facility-administered medications on file prior to visit.   Review of Systems As in subjective    Objective:   Physical Exam Due to coronavirus pandemic stay at home measures, patient visit  was virtual and they were not examined in person.   General: Well-developed well-nourished no acute distress No labored breathing or wheezing     Assessment:     Encounter Diagnosis  Name Primary?   Influenza A Yes       Plan:    Prescription given for Tamiflu, discussed risks/benefits of medication.    Discussed diagnosis of influenza. Discussed supportive care including rest, hydration, OTC Tylenol or NSAID for fever, aches, and malaise.  Discussed period of contagion, self quarantine at home away from others to avoid spread of disease, discussed means of transmission, and possible complications including pneumonia.  If worse or not improving within the next 4-5 days, then call or return.  Patient voiced understanding of diagnosis, recommendations, and treatment plan.   Nkechi was seen today for cough.  Diagnoses and all orders for this visit:  Influenza A  Other orders -     oseltamivir (TAMIFLU) 75 MG capsule; Take 1 capsule (75 mg total) by mouth 2 (two) times daily.  Follow-up as needed

## 2022-09-15 ENCOUNTER — Encounter: Payer: Self-pay | Admitting: Family Medicine

## 2022-09-15 ENCOUNTER — Telehealth (INDEPENDENT_AMBULATORY_CARE_PROVIDER_SITE_OTHER): Payer: Commercial Managed Care - PPO | Admitting: Nurse Practitioner

## 2022-09-15 ENCOUNTER — Encounter: Payer: Self-pay | Admitting: Nurse Practitioner

## 2022-09-15 ENCOUNTER — Other Ambulatory Visit (HOSPITAL_COMMUNITY): Payer: Self-pay

## 2022-09-15 VITALS — Wt 145.0 lb

## 2022-09-15 DIAGNOSIS — J0140 Acute pansinusitis, unspecified: Secondary | ICD-10-CM

## 2022-09-15 MED ORDER — AZITHROMYCIN 500 MG PO TABS
500.0000 mg | ORAL_TABLET | Freq: Every day | ORAL | 0 refills | Status: DC
  Filled 2022-09-15: qty 5, 5d supply, fill #0

## 2022-09-15 MED ORDER — PSEUDOEPH-BROMPHEN-DM 30-2-10 MG/5ML PO SYRP
5.0000 mL | ORAL_SOLUTION | Freq: Four times a day (QID) | ORAL | 0 refills | Status: DC | PRN
  Filled 2022-09-15: qty 120, 6d supply, fill #0

## 2022-09-15 NOTE — Progress Notes (Signed)
Virtual Visit Encounter mychart visit.   I connected with  [name] on 09/15/22 at  4:30 PM EST by secure video and audio telemedicine application. I verified that I am speaking with the correct person using two identifiers.   I introduced myself as a Designer, jewellery with the practice. The limitations of evaluation and management by telemedicine discussed with the patient and the availability of in person appointments. The patient expressed verbal understanding and consent to proceed.  Participating parties in this visit include: Myself and patient  The patient is: Patient Location: Home I am: Provider Location: Office/Clinic Subjective:    CC and HPI: Julia Vasquez is a 22 y.o. year old female presenting for new evaluation and treatment of sore throat. Patient reports the following: Alany presents today with sore throat, cough, sinus congestion, headache, and stomach upset that started last Friday. She reports pressure in her face, as well. She has not had any fevers that she knows of. She tells me initially her throat was very sore, but this has improved a little. 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   Past medical history, Surgical history, Family history not pertinant except as noted below, Social history, Allergies, and medications have been entered into the medical record, reviewed, and corrections made.   Review of Systems:  All review of systems negative except what is listed in the HPI  Objective:    Alert and oriented x 4 Congested with cough present.  Speaking in clear sentences with no shortness of breath. No distress.  Impression and Recommendations:    Problem List Items Addressed This Visit     Acute non-recurrent pansinusitis - Primary    Symptoms of sinusitis present with onset last Friday. She also has a sore throat, which does make me concerned for possible strep, as this is going  around right now. We will begin antibiotic treatment today given the severity of her symptoms. I do not feel that strep testing is required given that antibiotic therapy is needed for sinusitis. She is allergic to PCN, therefore, I will send treatment with azithromycin and monitor closely. Will also send cough medicine to help with congestion, sinus pressure, and cough. I have encouraged her to follow-up if she is not feeling better in the next few days.       Relevant Medications   azithromycin (ZITHROMAX) 500 MG tablet   brompheniramine-pseudoephedrine-DM 30-2-10 MG/5ML syrup    orders and follow up as documented in EMR I discussed the assessment and treatment plan with the patient. The patient was provided an opportunity to ask questions and all were answered. The patient agreed with the plan and demonstrated an understanding of the instructions.   The patient was advised to call back or seek an in-person evaluation if the symptoms worsen or if the condition fails to improve as anticipated.  Follow-Up: prn  I provided 18 minutes of non-face-to-face interaction with this non face-to-face encounter including intake, same-day documentation, and chart review.   Orma Render, NP , DNP, AGNP-c [facility]

## 2022-09-15 NOTE — Assessment & Plan Note (Signed)
Symptoms of sinusitis present with onset last Friday. She also has a sore throat, which does make me concerned for possible strep, as this is going around right now. We will begin antibiotic treatment today given the severity of her symptoms. I do not feel that strep testing is required given that antibiotic therapy is needed for sinusitis. She is allergic to PCN, therefore, I will send treatment with azithromycin and monitor closely. Will also send cough medicine to help with congestion, sinus pressure, and cough. I have encouraged her to follow-up if she is not feeling better in the next few days.

## 2022-09-23 ENCOUNTER — Other Ambulatory Visit (HOSPITAL_COMMUNITY): Payer: Self-pay

## 2022-09-29 ENCOUNTER — Other Ambulatory Visit (HOSPITAL_COMMUNITY): Payer: Self-pay

## 2022-11-12 ENCOUNTER — Telehealth (INDEPENDENT_AMBULATORY_CARE_PROVIDER_SITE_OTHER): Payer: Commercial Managed Care - PPO | Admitting: Family Medicine

## 2022-11-12 ENCOUNTER — Encounter: Payer: Self-pay | Admitting: Family Medicine

## 2022-11-12 ENCOUNTER — Other Ambulatory Visit: Payer: Self-pay | Admitting: *Deleted

## 2022-11-12 VITALS — Temp 98.8°F | Ht 65.0 in | Wt 140.0 lb

## 2022-11-12 DIAGNOSIS — R1110 Vomiting, unspecified: Secondary | ICD-10-CM

## 2022-11-12 DIAGNOSIS — R6883 Chills (without fever): Secondary | ICD-10-CM

## 2022-11-12 DIAGNOSIS — R519 Headache, unspecified: Secondary | ICD-10-CM

## 2022-11-12 DIAGNOSIS — J029 Acute pharyngitis, unspecified: Secondary | ICD-10-CM

## 2022-11-12 DIAGNOSIS — J069 Acute upper respiratory infection, unspecified: Secondary | ICD-10-CM | POA: Diagnosis not present

## 2022-11-12 NOTE — Progress Notes (Signed)
Start time: 1:31 End time: 2pm  Virtual Visit via Video Note  I connected with [place] on 11/12/22 by a video enabled telemedicine application and verified that I am speaking with the correct person using two identifiers.  Location: Patient: home Provider: office   I discussed the limitations of evaluation and management by telemedicine and the availability of in person appointments. The patient expressed understanding and agreed to proceed.  History of Present Illness:  Chief Complaint  Patient presents with   Sore Throat    VIRTUAL ST, HA, vomiting, difficulty breathing, hot and cold flashes. Last week had exposure to someone with covid. Symptoms started last night. No home covid tests.    Last night she noted slight sore throat, which got worse through the night. She took Delsym last night (thought it would help her throat, denies any coughing). Sore throat was much worse this morning, had headache, nausea and vomited 3x.  No diarrhea. Nausea has improved, she took zofran 1-2 hours ago. She got hot and cold flashes. She took tylenol 1-2 hours ago, motrin around 8 am. T98.8 an hour or two ago (after these meds).  Didn't check temperature earlier this morning, when she felt worse.  She denies any cough. She notes chest congestion, harder to breathe.  Denies DOE. Denies wheezing. Throat still hurts. Throat is a little pink, tonsil are normal. Nose is completely stuffed, starting today.  Some PND. 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  She hasn't had COVID vaccines, did not get flu shot this year. Had COVID twice, none in the last year.  PMH, PSH, SH reviewed  Outpatient Encounter Medications as of 11/12/2022  Medication Sig Note   acetaminophen (TYLENOL) 500 MG tablet Take 1,000 mg by mouth every 6 (six) hours as needed. 11/12/2022: Took 2 at 11:00   Dextromethorphan HBr (DELSYM  PO) Take 10 mLs by mouth as needed. 11/12/2022: Took last night   ibuprofen (ADVIL) 200 MG tablet Take 400 mg by mouth every 6 (six) hours as needed. 11/12/2022: Took 400mg  @ 8:00am   Multiple Vitamins-Minerals (MULTI-VITAMIN GUMMIES) CHEW Chew 1 tablet by mouth daily.    Norgestimate-Ethinyl Estradiol Triphasic (TRI FEMYNOR) 0.18/0.215/0.25 MG-35 MCG tablet Take 1 tablet by mouth daily.    ondansetron (ZOFRAN-ODT) 4 MG disintegrating tablet Take 1 tablet (4 mg total) by mouth every 8 (eight) hours as needed for nausea or vomiting. 11/12/2022: Took one at 11:00am   propranolol (INDERAL) 10 MG tablet Take 1 tablet (10 mg total) by mouth 4 (four) times daily as needed for palpitations. (Patient not taking: Reported on 11/12/2022) 11/12/2022: An needed   [DISCONTINUED] azithromycin (ZITHROMAX) 500 MG tablet Take 1 tablet (500 mg total) by mouth daily.    [DISCONTINUED] brompheniramine-pseudoephedrine-DM 30-2-10 MG/5ML syrup Take 5 mLs by mouth 4 (four) times daily as needed.    [DISCONTINUED] dextromethorphan-guaiFENesin (MUCINEX DM) 30-600 MG 12hr tablet Take 1 tablet by mouth 2 (two) times daily. 08/04/2022: Last dose last night   [DISCONTINUED] ibuprofen (ADVIL) 800 MG tablet Take 800 mg by mouth every 8 (eight) hours as needed. 08/04/2022: Last dose this morning 8am   No facility-administered encounter medications on file as of 11/12/2022.   Allergies  Allergen Reactions   Cefuroxime Axetil Diarrhea   Amoxil [Amoxicillin Trihydrate] Rash   ROS: subjective fever, hot/cold, headache, sore throat, nasal congestion, chest congestion, per HPI. +nausea/vomiting this morning. No diarrhea, abdominal pain, urinary  complaints. No rashes, bleeding, bruising No chest pain, DOE See HPI.    Observations/Objective:  Temp 98.8 F (37.1 C) (Temporal)   Ht 5\' 5"  (1.651 m)   Wt 140 lb (63.5 kg)   LMP 10/28/2022 (Exact Date)   BMI 23.30 kg/m   Pleasant, well-appearing female, appears comfortable and in no  distress. Nasal congestion noted in her voice. No coughing, occasional sniffle during visit. She is alert and oriented, cranial nerves grossly intact. Normal eye contact, speech. Exam is limited due to the virtual nature of the visit.  Assessment and Plan:  Viral upper respiratory illness - Ddx reviewed. Too early to test for COVID Ag (sx started <24 hrs); Supportive measures reviewed.  COVID test tomorrow  COVID test tomorrow (lab visit, she doesn't have any tests at home). Work note to be off today and tomorrow. Not at high risk (other than being unvaccinated, but has already had COVID 2x), so no need for Paxlovid if tests + (symptoms are mild).  Discussed possibility of allergies in addition to viral URI. Supportive measures/OTC meds reviewed in detail. All questions answered.   Follow Up Instructions:    I discussed the assessment and treatment plan with the patient. The patient was provided an opportunity to ask questions and all were answered. The patient agreed with the plan and demonstrated an understanding of the instructions.   The patient was advised to call back or seek an in-person evaluation if the symptoms worsen or if the condition fails to improve as anticipated.  I spent 31 minutes dedicated to the care of this patient, including pre-visit review of records, face to face time, post-visit ordering of testing and documentation.    Vikki Ports, MD

## 2022-11-12 NOTE — Patient Instructions (Addendum)
Guaifenesin (found in mucinex and robutissin) to loosenup any mucus or phlegm. Use plain guaifenesin if you are going to use Delsym syrup if/when a cough develops If you get the DM version of Mucinex or Robitussin, then do NOT use Delsym. You may use antihistamines if needed for allergies (claritin, zyrtec or allegra), or start back on your nasal steroid sprays (flonase) if your allergies are starting to flare. I recommend use of sinus rinses once or twice daily, to help with sinus pressure, and clear the sinuses (before the mucus drains down your throat). Use decongestants cautiously (probably best to avoid), as they may increase palpitations.  Continue tylenol and/or ibuprofen as needed for pain. Monitor your temperature (checking before taking tylenol or advil, as these lower your temperature).

## 2022-11-13 ENCOUNTER — Other Ambulatory Visit (INDEPENDENT_AMBULATORY_CARE_PROVIDER_SITE_OTHER): Payer: Commercial Managed Care - PPO

## 2022-11-13 DIAGNOSIS — J029 Acute pharyngitis, unspecified: Secondary | ICD-10-CM | POA: Diagnosis not present

## 2022-11-13 DIAGNOSIS — R1110 Vomiting, unspecified: Secondary | ICD-10-CM

## 2022-11-13 DIAGNOSIS — R6883 Chills (without fever): Secondary | ICD-10-CM

## 2022-11-13 DIAGNOSIS — R519 Headache, unspecified: Secondary | ICD-10-CM

## 2022-11-13 LAB — POC COVID19 BINAXNOW: SARS Coronavirus 2 Ag: NEGATIVE

## 2022-12-03 ENCOUNTER — Other Ambulatory Visit (HOSPITAL_COMMUNITY): Payer: Self-pay

## 2023-01-06 ENCOUNTER — Encounter: Payer: Self-pay | Admitting: Family Medicine

## 2023-01-06 ENCOUNTER — Ambulatory Visit (INDEPENDENT_AMBULATORY_CARE_PROVIDER_SITE_OTHER): Payer: Commercial Managed Care - PPO | Admitting: Family Medicine

## 2023-01-06 VITALS — BP 100/68 | HR 80 | Temp 98.3°F | Ht 65.0 in | Wt 144.0 lb

## 2023-01-06 DIAGNOSIS — N644 Mastodynia: Secondary | ICD-10-CM

## 2023-01-06 NOTE — Progress Notes (Signed)
Chief Complaint  Patient presents with   Breast Pain    B/L breast pain and she noticed a lump on the left breast last week on the inner side of left breast.    She notices a lot of breast pain, more on the left than the right, more intense just prior and after her cycles. The pain is mostly in the inferior breast.  She noticed a lump in the left breast a day or so after her last cycle ended (noticed it around 5/9 or 5/10). This painful lump is in the inferior left breast.  She still has pain. Admits to being too scared to recheck the breast to be able to know if it has gotten bigger or smaller since she first noticed it. Both breasts remain a little tender.  She is on OCP's for many years, no change in type of OCP.  She reports her cycles are normal, no missed pills.  Tenderness in breasts has gotten worse recently (despite no changes). No change in caffeine intake, always eats a lot of chocolate.  No nipple discharge, skin dimpling or other concerns.  PMH, PSH, SH reviewed  Outpatient Encounter Medications as of 01/06/2023  Medication Sig Note   Multiple Vitamins-Minerals (MULTI-VITAMIN GUMMIES) CHEW Chew 1 tablet by mouth daily.    Norgestimate-Ethinyl Estradiol Triphasic (TRI FEMYNOR) 0.18/0.215/0.25 MG-35 MCG tablet Take 1 tablet by mouth daily.    Omega-3 Fatty Acids (FISH OIL) 1000 MG CAPS Take 1 capsule by mouth daily at 6 (six) AM.    acetaminophen (TYLENOL) 500 MG tablet Take 1,000 mg by mouth every 6 (six) hours as needed. (Patient not taking: Reported on 01/06/2023) 01/06/2023: As needed   ibuprofen (ADVIL) 200 MG tablet Take 400 mg by mouth every 6 (six) hours as needed. (Patient not taking: Reported on 01/06/2023) 01/06/2023: As needed   ondansetron (ZOFRAN-ODT) 4 MG disintegrating tablet Take 1 tablet (4 mg total) by mouth every 8 (eight) hours as needed for nausea or vomiting. (Patient not taking: Reported on 01/06/2023) 01/06/2023: As needed   propranolol (INDERAL) 10 MG tablet  Take 1 tablet (10 mg total) by mouth 4 (four) times daily as needed for palpitations. (Patient not taking: Reported on 11/12/2022) 01/06/2023: As needed   [DISCONTINUED] Dextromethorphan HBr (DELSYM PO) Take 10 mLs by mouth as needed. 11/12/2022: Took last night   No facility-administered encounter medications on file as of 01/06/2023.   Allergies  Allergen Reactions   Cefuroxime Axetil Diarrhea   Amoxil [Amoxicillin Trihydrate] Rash   ROS: no f/c, URI or allergy symptoms. Palpitations--notices a couple of times/month, occasionally requires BB, if not resolving quickly. Denies headaches/dizziness, n/v/d No vaginal discharge or abnormal vaginal bleeding.    PHYSICAL EXAM:  BP 100/68   Pulse 80   Temp 98.3 F (36.8 C) (Tympanic)   Ht 5\' 5"  (1.651 m)   Wt 144 lb (65.3 kg)   LMP 12/23/2022 (Exact Date)   BMI 23.96 kg/m   Pleasant, well-appearing female, in no distress. She is mildly anxious Breasts:  no nipple inversion, nipple discharge.  No axillary lymphadenopathy. R breast--minimal tenderness along the inferior portion of the breast. No significant FG changes were noted. L breast--focal area of tenderness at the inferior breast (above the area where a bra would sit).  There is diffuse fibroglandular changes throughout the left breast, mildly tender in other areas, more tender at the inferior portion, but exam is fairly consistent throughout. No dominant masses. Small ridge of indentation noted just above the area  of concerns (where multiple small glandular tissue is present).  Heart: regular rate and rhythm, no ectopy or murmur Lungs: clear bilaterally Neck: no lymphadenopathy or mass Extremities: no edema Neuro: alert and oriented, cranial nerves grossly intact, normal gait   ASSESSMENT/PLAN:  Breast pain - bilaterally, L>R, with focal area of pain L inferior breast.  Significant fibroglandular (vs cystic) changes throughout breast. no dominant mass  Pt reassured--likely  FG/FC changes causing discomfort and the palpable concern. Discussed monitoring for longer (1-2 cycles) to see if this resolves. To limit caffeine/chocolate. Declined pregnancy test today.  Discussed that if the lump/pain persisted, next steps would be ultrasound, and poss diagnostic mammo.  But given exam c/w benign findings, recommend observation for 1-2 cycles. Pt to return or contact us if sx persist or worsen at any time.

## 2023-03-22 ENCOUNTER — Other Ambulatory Visit: Payer: Self-pay | Admitting: Family Medicine

## 2023-03-22 ENCOUNTER — Other Ambulatory Visit (HOSPITAL_COMMUNITY): Payer: Self-pay

## 2023-03-22 DIAGNOSIS — Z3041 Encounter for surveillance of contraceptive pills: Secondary | ICD-10-CM

## 2023-03-22 MED ORDER — NORGESTIM-ETH ESTRAD TRIPHASIC 0.18/0.215/0.25 MG-35 MCG PO TABS
1.0000 | ORAL_TABLET | Freq: Every day | ORAL | 0 refills | Status: DC
  Filled 2023-03-22: qty 28, 28d supply, fill #0

## 2023-03-22 NOTE — Telephone Encounter (Signed)
You can refill for a month. (Rx was written for a year on 03/2022, unsure why she is out now).  I have an opening on Monday 8/26--the last patient of the morning stated she would be back at school and couldn't make that appointment, was supposed to cancel it on her way out [facility]), not sure why she didn't.   Her last CPE was 8/23, so needs to be after that date.

## 2023-03-22 NOTE — Telephone Encounter (Signed)
Called patient regarding this refill. She states she is out of pills and needs refill. I asked her to schedule CPE with me on the phone and I let her know next avail was 10/9 @ 2:45pm. She said she would have to call back as she is going back to work in Sept and does not her schedule and could not schedule appt.

## 2023-05-21 ENCOUNTER — Other Ambulatory Visit (HOSPITAL_COMMUNITY): Payer: Self-pay

## 2023-05-21 ENCOUNTER — Other Ambulatory Visit: Payer: Self-pay | Admitting: Physician Assistant

## 2023-05-21 MED ORDER — PROPRANOLOL HCL 10 MG PO TABS
10.0000 mg | ORAL_TABLET | Freq: Four times a day (QID) | ORAL | 0 refills | Status: DC | PRN
  Filled 2023-05-21: qty 60, 15d supply, fill #0

## 2023-06-23 ENCOUNTER — Ambulatory Visit: Payer: Commercial Managed Care - PPO | Admitting: Physician Assistant

## 2024-02-01 NOTE — Patient Instructions (Incomplete)
  HEALTH MAINTENANCE RECOMMENDATIONS:  It is recommended that you get at least 30 minutes of aerobic exercise at least 5 days/week (for weight loss, you may need as much as 60-90 minutes). This can be any activity that gets your heart rate up. This can be divided in 10-15 minute intervals if needed, but try and build up your endurance at least once a week.  Weight bearing exercise is also recommended twice weekly.  Eat a healthy diet with lots of vegetables, fruits and fiber.  Colorful foods have a lot of vitamins (ie green vegetables, tomatoes, red peppers, etc).  Limit sweet tea, regular sodas and alcoholic beverages, all of which has a lot of calories and sugar.  Up to 1 alcoholic drink daily may be beneficial for women (unless trying to lose weight, watch sugars).  Drink a lot of water.  Calcium recommendations are 1200-1500 mg daily (1500 mg for postmenopausal women or women without ovaries), and vitamin D  1000 IU daily.  This should be obtained from diet and/or supplements (vitamins), and calcium should not be taken all at once, but in divided doses.  Monthly self breast exams and yearly mammograms for women over the age of 64 is recommended.  Sunscreen of at least SPF 30 should be used on all sun-exposed parts of the skin when outside between the hours of 10 am and 4 pm (not just when at beach or pool, but even with exercise, golf, tennis, and yard work!)  Use a sunscreen that says broad spectrum so it covers both UVA and UVB rays, and make sure to reapply every 1-2 hours.  Remember to change the batteries in your smoke detectors when changing your clock times in the spring and fall. Carbon monoxide detectors are recommended for your home.  Use your seat belt every time you are in a car, and please drive safely and not be distracted with cell phones and texting while driving.  Yearly flu shots are recommended. Please get this in the Fall (sept/oct).  Contact us  if you do not have a  period after you finish the provera course. We may need to refer you to a gynecologist. If you continue to go >3 months without cycles, you should consider restarting the birth control pills, vs using provera when this occurs.  I expect that your cholesterol will be high, and that we will arrange for a follow-up cholesterol test after you have made some dietary changes as we discussed.

## 2024-02-01 NOTE — Progress Notes (Unsigned)
 No chief complaint on file.   Julia Vasquez is a 23 y.o. female who presents for a complete physical.     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 She had more frequent illness that first year (viruses, influenza, sore throats, sinusitis).   She was last seen by me in 12/2022 with complaint of breast pain.  Felt to be related to FG/FC changes, and to f/u if symptoms persisted after another 1-2 cycles.  She had been on OCP's for many years, no change in type of OCP.  Cycles were normal, no missed pills.  12/2022 she reported that tenderness in her breasts had gotten worse recently (despite no changes). No change in caffeine intake, always eats a lot of chocolate.  She stopped taking OCPs ***  Contraception??***  She is in a monogamous relationship (engaged 07/2021, wedding *** ) Prior h/o chlamydia in 2020 with negative test of cure.     Palpitations-- Prior heart monitor was unremarkable (some PAC's noted).  Episodes are generally a few times per month, short-lived.  Described as flutters with some chest discomfort, no SOB. She last saw cardiologist in 11/2021.  She was prescribed propranolol  10 mg to use prn (as she had episodes lasting longer, with associated dizziness and SOB). She uses this ***   Hand eczema:  Uses Eucrisa when needed for flares (got from derm), only needs it in the winter.  No issues otherwise.  Only usually flares on L 4th finger, sometimes 3rd and 5th.  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  She would get them on her way home after work, and were relieved by Tylenol  (and if that didn't work, going to sleep would.) ***any migraines?  Marijuana--hasn't smoked in a few months.  Uses sporadically. Tobacco use--Still vaping.  Takes a few hits  per hour    Hyperlipidemia:  This was noted on screening labs in 2017.  Her mother has hyperlipidemia and is on statin, and there is FHx of CAD (MGM).   Lipids had improved some with diet, but was higher again on last check in 03/2022.  We reviewed low cholesterol diet, offered dietician, and plan was to repeat lipids in 1 year at physical (she did not come for a physical last year).  *** UPDATE DIET  Current diet: Not drinking any sweet tea, only 2 sodas/week. She drinks a lot of juice (watermelon, pineapple and grape). No longer uses ranch dressing. Eats a lot of fruits, vegetables, salads. Not much cheese (on burgers, just some grated parm on salads). Eats fast food 2x/week--burgers, fries, chicken tenders Some chocolate  Component Ref Range & Units (hover) 1 yr ago 2 yr ago 4 yr ago 5 yr ago 7 yr ago  Cholesterol, Total 256 High  242 High  253 High  236 High  R   Triglycerides 116 183 High  205 High  185 High  R 236 High  R  HDL 66 61 67 65 59 R  VLDL Cholesterol Cal 20 33 37 37   LDL Chol Calc (NIH) 170 High  148 High  149 High     Chol/HDL Ratio 3.9 4.0 CM 3.8 CM 3.6 CM 4.5 R      Immunization History  Administered Date(s) Administered   DTaP 01/31/1999, 04/03/1999, 06/20/1999, 06/03/2000, 12/11/2003   H1N1 06/18/2008,  07/18/2008   HIB (PRP-OMP) 04/03/1999, 06/20/1999, 06/03/2000   HPV Quadrivalent 03/19/2010, 08/22/2010, 05/04/2013   Hepatitis A 06/29/2006   Hepatitis A, Ped/Adol-2 Dose 05/04/2013   Hepatitis B 01/31/1999, 09/03/1999, 12/07/1999   IPV 01/31/1999, 06/20/1999, 09/03/1999, 06/03/2000   Influenza Whole 06/07/2006   Influenza,Quad,Nasal, Live 05/31/2013, 07/17/2014   MMR 12/25/1999, 12/11/2003   Meningococcal Conjugate 05/04/2013   Pneumococcal Conjugate-13 01/31/1999, 06/20/1999, 09/03/1999, 06/03/2000   Tdap 03/19/2010, 02/13/2021   Varicella 12/25/1999   She has declined recommended vaccinations including Menveo#2, Bexsero, flu shots and COVID  vaccines. +varicella IgG 05/2016 Pap smear: 11/2019, normal, negative Chlamydia and GC. Dentist: twice a year  Ophtho: yearly Exercise:    Still dances at home, 1x/week (1-2 hours); does yoga a few times/week;  no longer using weights.   Vitamin D -OH 33 in 05/2016  PMH, PSH, SH reviewed   ROS:  Denies fever, chills, URI symptoms or allergies. Denies numbness, tingling, dizziness. Denies heartburn.  Normal bowels. Denies urinary complaints, vaginal discharge. Menses are regular on OCP's. No cough, shortness of breath.  Palpitations/tachycardia per HPI, infrequent. Breast pain? *** Denies depression, anxiety. Hand eczema, mainly in winter. No longer having migraines. Sleep paralysis--occasionally, infrequent.    PHYSICAL EXAM:  There were no vitals taken for this visit.  Wt Readings from Last 3 Encounters:  01/06/23 144 lb (65.3 kg)  11/12/22 140 lb (63.5 kg)  09/15/22 145 lb (65.8 kg)    Well developed, pleasant female in no distress. In good spirits HEENT: PERRL, EOMI, conjunctiva clear. TM's and EAC's normal. No sinus tenderness. OP clear Neck: supple. No lymphadenopathy, masses or thyromegaly, no carotid bruit. Heart: regular rate and rhythm without murmur or ectopy Lungs: clear bilaterally with good air movement   Chest: nontender to palpation Breasts: No nipple inversion, discharge, skin dimpling, breast masses, tenderness or axillary lymphadenopathy.  Abdomen: soft, nontender, no organomegaly or mass. Umbilical piercing. Skin: skin on face and back is clear, without any significant acne. No suspicious lesions.  Skin on hands are normal (no flare of eczema currently) GU: *** Pap performed  Extremities: No edema, normal pulses Neuro: alert and oriented. Normal strength, DTR's 2+. Normal gait   Back: no spinal tenderness, CVA tenderness. No scoliosis   Psych: normal mood, affect, hygiene and grooming.     04/15/2022    2:13 PM 02/13/2021    1:26 PM 12/18/2019     1:41 PM 09/28/2018    3:10 PM 09/22/2017    2:26 PM  Depression screen PHQ 2/9  Decreased Interest 0 0 0 0 0  Down, Depressed, Hopeless 0 0 0 0 0  PHQ - 2 Score 0 0 0 0 0    ASSESSMENT/PLAN:   Pap smear (past due--she never came for CPE last year) Is she no longer on OCP's? (Last rx'd a month in 02/2023. She never had CPE last year  Counseled re: vaping, safe sex, contraception, low cholesterol diet, substance abuse, tobacco/vaping use, proper sunscreen, seatbelt use. Reviewed lowfat, low cholesterol, healthy diet. Encouraged cutting back on sugary beverages/juices and fast (fried) foods.  Yearly flu shots encouraged.   F/u 1 year, sooner prn

## 2024-02-02 ENCOUNTER — Ambulatory Visit: Payer: Self-pay | Admitting: Family Medicine

## 2024-02-02 ENCOUNTER — Ambulatory Visit (INDEPENDENT_AMBULATORY_CARE_PROVIDER_SITE_OTHER): Admitting: Family Medicine

## 2024-02-02 ENCOUNTER — Encounter: Payer: Self-pay | Admitting: Family Medicine

## 2024-02-02 ENCOUNTER — Other Ambulatory Visit (HOSPITAL_COMMUNITY): Payer: Self-pay

## 2024-02-02 ENCOUNTER — Other Ambulatory Visit (HOSPITAL_COMMUNITY)
Admission: RE | Admit: 2024-02-02 | Discharge: 2024-02-02 | Disposition: A | Discharge status: Home / Self Care | Source: Ambulatory Visit | Attending: Family Medicine | Admitting: Family Medicine

## 2024-02-02 VITALS — BP 100/64 | HR 64 | Ht 65.0 in | Wt 132.0 lb

## 2024-02-02 DIAGNOSIS — E782 Mixed hyperlipidemia: Secondary | ICD-10-CM | POA: Diagnosis not present

## 2024-02-02 DIAGNOSIS — Z0000 Encounter for general adult medical examination without abnormal findings: Secondary | ICD-10-CM

## 2024-02-02 DIAGNOSIS — K219 Gastro-esophageal reflux disease without esophagitis: Secondary | ICD-10-CM

## 2024-02-02 DIAGNOSIS — R634 Abnormal weight loss: Secondary | ICD-10-CM | POA: Diagnosis not present

## 2024-02-02 DIAGNOSIS — Z720 Tobacco use: Secondary | ICD-10-CM

## 2024-02-02 DIAGNOSIS — N915 Oligomenorrhea, unspecified: Secondary | ICD-10-CM

## 2024-02-02 LAB — POCT URINE PREGNANCY: Preg Test, Ur: NEGATIVE

## 2024-02-02 MED ORDER — MEDROXYPROGESTERONE ACETATE 10 MG PO TABS
10.0000 mg | ORAL_TABLET | Freq: Every day | ORAL | 0 refills | Status: DC
  Filled 2024-02-02: qty 10, 10d supply, fill #0

## 2024-02-03 LAB — COMPREHENSIVE METABOLIC PANEL WITH GFR
ALT: 11 IU/L (ref 0–32)
AST: 12 IU/L (ref 0–40)
Albumin: 4.9 g/dL (ref 4.0–5.0)
Alkaline Phosphatase: 88 IU/L (ref 44–121)
BUN/Creatinine Ratio: 21 (ref 9–23)
BUN: 14 mg/dL (ref 6–20)
Bilirubin Total: 0.3 mg/dL (ref 0.0–1.2)
CO2: 22 mmol/L (ref 20–29)
Calcium: 9.9 mg/dL (ref 8.7–10.2)
Chloride: 100 mmol/L (ref 96–106)
Creatinine, Ser: 0.68 mg/dL (ref 0.57–1.00)
Globulin, Total: 2.6 g/dL (ref 1.5–4.5)
Glucose: 76 mg/dL (ref 70–99)
Potassium: 4.2 mmol/L (ref 3.5–5.2)
Sodium: 138 mmol/L (ref 134–144)
Total Protein: 7.5 g/dL (ref 6.0–8.5)
eGFR: 124 mL/min/{1.73_m2} (ref >59)

## 2024-02-03 LAB — LIPID PANEL
Chol/HDL Ratio: 3.3 ratio (ref 0.0–4.4)
Cholesterol, Total: 206 mg/dL — ABNORMAL HIGH (ref 100–199)
HDL: 62 mg/dL (ref >39)
LDL Chol Calc (NIH): 132 mg/dL — ABNORMAL HIGH (ref 0–99)
Triglycerides: 65 mg/dL (ref 0–149)
VLDL Cholesterol Cal: 12 mg/dL (ref 5–40)

## 2024-02-03 LAB — CBC WITH DIFFERENTIAL/PLATELET
Basophils Absolute: 0.1 10*3/uL (ref 0.0–0.2)
Basos: 1 %
EOS (ABSOLUTE): 0.1 10*3/uL (ref 0.0–0.4)
Eos: 1 %
Hematocrit: 40.8 % (ref 34.0–46.6)
Hemoglobin: 13.5 g/dL (ref 11.1–15.9)
Immature Grans (Abs): 0 10*3/uL (ref 0.0–0.1)
Immature Granulocytes: 0 %
Lymphocytes Absolute: 2.5 10*3/uL (ref 0.7–3.1)
Lymphs: 37 %
MCH: 31.1 pg (ref 26.6–33.0)
MCHC: 33.1 g/dL (ref 31.5–35.7)
MCV: 94 fL (ref 79–97)
Monocytes Absolute: 0.4 10*3/uL (ref 0.1–0.9)
Monocytes: 7 %
Neutrophils Absolute: 3.5 10*3/uL (ref 1.4–7.0)
Neutrophils: 54 %
Platelets: 241 10*3/uL (ref 150–450)
RBC: 4.34 x10E6/uL (ref 3.77–5.28)
RDW: 11.5 % — ABNORMAL LOW (ref 11.7–15.4)
WBC: 6.6 10*3/uL (ref 3.4–10.8)

## 2024-02-03 LAB — FSH/LH
FSH: 7.7 m[IU]/mL
LH: 22.5 m[IU]/mL

## 2024-02-03 LAB — TSH: TSH: 1.62 u[IU]/mL (ref 0.450–4.500)

## 2024-02-03 LAB — PROLACTIN: Prolactin: 13.6 ng/mL (ref 4.8–33.4)

## 2024-02-07 LAB — CYTOLOGY - PAP
Chlamydia: NEGATIVE
Comment: NEGATIVE
Comment: NORMAL
Diagnosis: NEGATIVE
Neisseria Gonorrhea: NEGATIVE

## 2024-02-11 ENCOUNTER — Other Ambulatory Visit (HOSPITAL_COMMUNITY): Payer: Self-pay

## 2024-02-21 ENCOUNTER — Other Ambulatory Visit (HOSPITAL_COMMUNITY): Payer: Self-pay

## 2024-02-21 MED ORDER — SODIUM FLUORIDE 0.2 % MT SOLN
OROMUCOSAL | 5 refills | Status: AC
  Filled 2024-02-21: qty 473, 30d supply, fill #0

## 2024-04-20 ENCOUNTER — Encounter: Payer: Self-pay | Admitting: Nurse Practitioner

## 2024-04-20 ENCOUNTER — Ambulatory Visit (INDEPENDENT_AMBULATORY_CARE_PROVIDER_SITE_OTHER): Admitting: Nurse Practitioner

## 2024-04-20 ENCOUNTER — Other Ambulatory Visit (HOSPITAL_COMMUNITY): Payer: Self-pay

## 2024-04-20 VITALS — BP 118/72 | HR 68 | Wt 129.8 lb

## 2024-04-20 DIAGNOSIS — N926 Irregular menstruation, unspecified: Secondary | ICD-10-CM

## 2024-04-20 DIAGNOSIS — N912 Amenorrhea, unspecified: Secondary | ICD-10-CM | POA: Diagnosis not present

## 2024-04-20 DIAGNOSIS — N915 Oligomenorrhea, unspecified: Secondary | ICD-10-CM | POA: Diagnosis not present

## 2024-04-20 MED ORDER — MEDROXYPROGESTERONE ACETATE 10 MG PO TABS
10.0000 mg | ORAL_TABLET | Freq: Every day | ORAL | 0 refills | Status: DC
  Filled 2024-04-20: qty 10, 10d supply, fill #0

## 2024-04-20 NOTE — Patient Instructions (Addendum)
 We have checked a few other labs today. I am looking for specific hormonal changes to help determine if PCOS or other causes may be leading to the missed or irregular periods.   I have also ordered a pelvic ultrasound. The imaging office will call you to schedule this. This typically requires them to look on the lower abdomen and from inside the vagina to see everything clearly. The vaginal ultrasound is not painful, but I do like to warn patients so they are prepared that this may be needed.   You will see the labs immediately.  I will review the labs once all of the results have come in and compare these with previous labs, review the medical record, and come up with a plan.   You can start the medroxyprogesterone  as soon as you would like. You can expect to have a period within a week of stopping this medication. Hopefully this one won't last 2 weeks!  I put some information on PCOS on the back of this just to read up if you would like.

## 2024-04-20 NOTE — Assessment & Plan Note (Addendum)
 Amenorrhea since discontinuation of birth control in September last year. Previous withdrawal bleeding induced by progesterone in June, but no further menses. We discussed that previous lab results show LH to [facility] ratio greater than 2, which can be suggestive of PCOS. Differential diagnosis includes PCOS and other endocrine dysfunctions. Normal thyroid, prolactin, STI, and pregnancy tests in June.  Discussed potential PCOS diagnosis based on symptoms and lab results, but further testing to confirm is needed. She prefers to avoid birth control but is open to it if necessary.  We discussed that an ultrasound can be helpful to confirm presence of cysts but is not immediately necessary unless lab results are inconclusive or she desires further evaluation. I have left it open to her to have this done immediately or wait for the labs. We discussed that transvaginal evaluation is likely needed.  - Order serum testosterone , DHEA, and 17-hydroxyprogesterone levels and repeat FSH/LH - Prescribe Provera  for 10 days to induce withdrawal bleeding - Order pelvic ultrasound if lab results are inconclusive or if she desires further evaluation - Perform blood pregnancy and repeat STI test today - Consider spironolactone for acne, if this is desired.

## 2024-04-20 NOTE — Progress Notes (Signed)
 Camie FORBES Doing, DNP, AGNP-c [facility] [address] [place], [place] [address] [phone]   ACUTE VISIT on 04/20/2024  Blood pressure 118/72, pulse 68, weight 129 lb 12.8 oz (58.9 kg), last menstrual period 02/16/2024.  Subjective:  HPI History of Present Illness Julia Vasquez is a 23 year old female who presents with amenorrhea after discontinuing birth control.  She stopped taking birth control in September of last year and has not had a menstrual period since. A withdrawal bleed was induced by medication (provera  10 day course), resulting in a two-week period, but no subsequent periods have occurred.  She has a history of heavy and sometimes irregular periods prior to starting her period, which was her reason for starting OCP.  While on birth control, her periods were regular, but since discontinuing, she has experienced amenorrhea.  She experiences severe acne and frequent cramps that suggest an impending period, but menstruation does not occur. No unusual hair growth is noted. She has been on birth control for about ten years and is open to resuming it if necessary to regulate her menstrual cycle.  No changes in bowel habits but reports frequent urination. Multiple pregnancy tests have been negative. She is open to STI testing today.   Her family history includes a sister who may have PCOS.   ROS negative except for what is listed in HPI. History, Medications, Surgery, SDOH, and Family History reviewed and updated as appropriate.  Objective:  Physical Exam Vitals and nursing note reviewed.  Constitutional:      Appearance: Normal appearance. She is normal weight.  HENT:     Head: Normocephalic.  Cardiovascular:     Rate and Rhythm: Normal rate and regular rhythm.     Pulses: Normal pulses.     Heart sounds: Normal heart sounds.  Pulmonary:     Effort: Pulmonary effort is normal.     Breath sounds: Normal breath sounds.  Abdominal:     General:  Bowel sounds are normal. There is no distension.     Palpations: Abdomen is soft.     Tenderness: There is no abdominal tenderness. There is no guarding or rebound.  Musculoskeletal:     Cervical back: Neck supple. No tenderness.  Lymphadenopathy:     Cervical: No cervical adenopathy.  Skin:    General: Skin is warm and dry.     Capillary Refill: Capillary refill takes less than 2 seconds.  Neurological:     Mental Status: She is alert.  Psychiatric:        Mood and Affect: Mood normal.        Behavior: Behavior normal.         Assessment & Plan:   Problem List Items Addressed This Visit     Amenorrhea - Primary   Amenorrhea since discontinuation of birth control in September last year. Previous withdrawal bleeding induced by progesterone in June, but no further menses. We discussed that previous lab results show LH to [facility] ratio greater than 2, which can be suggestive of PCOS. Differential diagnosis includes PCOS and other endocrine dysfunctions. Normal thyroid, prolactin, STI, and pregnancy tests in June.  Discussed potential PCOS diagnosis based on symptoms and lab results, but further testing to confirm is needed. She prefers to avoid birth control but is open to it if necessary.  We discussed that an ultrasound can be helpful to confirm presence of cysts but is not immediately necessary unless lab results are inconclusive or she desires further evaluation. I have  left it open to her to have this done immediately or wait for the labs. We discussed that transvaginal evaluation is likely needed.  - Order serum testosterone , DHEA, and 17-hydroxyprogesterone levels and repeat FSH/LH - Prescribe Provera  for 10 days to induce withdrawal bleeding - Order pelvic ultrasound if lab results are inconclusive or if she desires further evaluation - Perform blood pregnancy and repeat STI test today - Consider spironolactone for acne, if this is desired.       Relevant Medications    medroxyPROGESTERone  (PROVERA ) 10 MG tablet   Other Visit Diagnoses       Menstrual abnormality       Relevant Orders   Chlamydia/Gonococcus/Trichomonas, NAA   HIV Antibody (routine testing w rflx)   RPR       Camie FORBES Doing, DNP, AGNP-c Time: 28 minutes, >50% spent counseling, care coordination, chart review, and documentation.

## 2024-04-21 ENCOUNTER — Ambulatory Visit: Payer: Self-pay | Admitting: Nurse Practitioner

## 2024-04-21 LAB — BETA HCG QUANT (REF LAB): hCG Quant: <1 m[IU]/mL

## 2024-04-21 LAB — RPR: RPR Ser Ql: NONREACTIVE

## 2024-04-21 LAB — HIV ANTIBODY (ROUTINE TESTING W REFLEX): HIV Screen 4th Generation wRfx: NONREACTIVE

## 2024-04-22 LAB — CHLAMYDIA/GONOCOCCUS/TRICHOMONAS, NAA
Chlamydia by NAA: NEGATIVE
Gonococcus by NAA: NEGATIVE
Trich vag by NAA: NEGATIVE

## 2024-04-29 LAB — FSH/LH
FSH: 7.8 m[IU]/mL
LH: 21.5 m[IU]/mL

## 2024-04-29 LAB — 17-HYDROXYPROGESTERONE: 17-OH Progesterone LCMS: 93 ng/dL

## 2024-04-29 LAB — TESTOSTERONE, TOTAL, LC/MS/MS: Testosterone, total: 60.9 ng/dL — ABNORMAL HIGH (ref 10.0–55.0)

## 2024-04-29 LAB — DHEA-SULFATE, SERUM: DHEA-Sulfate, LCMS: 135 ug/dL

## 2024-08-18 ENCOUNTER — Other Ambulatory Visit (HOSPITAL_COMMUNITY): Payer: Self-pay

## 2024-08-18 ENCOUNTER — Encounter: Payer: Self-pay | Admitting: Nurse Practitioner

## 2024-08-18 ENCOUNTER — Ambulatory Visit: Admitting: Nurse Practitioner

## 2024-08-18 VITALS — BP 120/78 | HR 78 | Wt 136.6 lb

## 2024-08-18 DIAGNOSIS — L700 Acne vulgaris: Secondary | ICD-10-CM | POA: Diagnosis not present

## 2024-08-18 DIAGNOSIS — E282 Polycystic ovarian syndrome: Secondary | ICD-10-CM

## 2024-08-18 DIAGNOSIS — N907 Vulvar cyst: Secondary | ICD-10-CM | POA: Diagnosis not present

## 2024-08-18 MED ORDER — SPIRONOLACTONE 50 MG PO TABS
ORAL_TABLET | ORAL | 1 refills | Status: AC
  Filled 2024-08-18: qty 83, 90d supply, fill #0

## 2024-08-18 NOTE — Patient Instructions (Addendum)
 I have sent in a medication called Spironolactone . This medication helps to block the testosterone  production in the body and subsequently will help with acne formation. It can take a few weeks to completely balance everything out, so be patient with the process.  We can consider adding topical tretinoin at any point, if this is not helping enough.   Take 1/2 tablet in the morning for 2 weeks then increase to a full tablet.  I recommend the morning so it doesn't wake you up to use the bathroom at night.   Polycystic Ovary Syndrome  Polycystic ovarian syndrome (PCOS) is a common hormonal disorder among women of reproductive age. In most women with PCOS, small fluid-filled sacs (cysts) grow on the ovaries. PCOS can cause problems with menstrual periods and make it hard to get and stay pregnant. If this condition is not treated, it can lead to serious health problems, such as diabetes and heart disease. What are the causes? The cause of this condition is not known. It may be due to certain factors, such as: Irregular menstrual cycle. High levels of certain hormones. Problems with the hormone that helps to control blood sugar (insulin). Certain genes. What increases the risk? You are more likely to develop this condition if you: Have a family history of PCOS or type 2 diabetes. Are overweight, eat unhealthy foods, and are not active. These factors may cause problems with blood sugar control, which can contribute to PCOS or PCOS symptoms. What are the signs or symptoms? Symptoms of this condition include: Ovarian cysts and sometimes pelvic pain. Menstrual periods that are not regular or are too heavy. Inability to get or stay pregnant. Increased growth of hair on the face, chest, stomach, back, thumbs, thighs, or toes. Acne or oily skin. Acne may develop during adulthood, and it may not get better with treatment. Weight gain or obesity. Patches of thickened and dark brown or black skin on the  neck, arms, breasts, or thighs. How is this diagnosed? This condition is diagnosed based on: Your medical history. A physical exam that includes a pelvic exam. Your health care provider may look for areas of increased hair growth on your skin. Tests, such as: An ultrasound to check the ovaries for cysts and to view the lining of the uterus. Blood tests to check levels of sugar (glucose), female hormone (testosterone ), and female hormones (estrogen and progesterone). How is this treated? There is no cure for this condition, but treatment can help to manage symptoms and prevent more health problems from developing. Treatment varies depending on your symptoms and if you want to have a baby or if you need birth control. Treatment may include: Making nutrition and lifestyle changes. Taking the progesterone hormone to start a menstrual period. Taking birth control pills to help you have regular menstrual periods. Taking medicines such as: Medicines to make you ovulate, if you want to get pregnant. Medicine to reduce extra hair growth. Having surgery in severe cases. This may involve making small holes in one or both of your ovaries. This decreases the amount of testosterone  that your body makes. Follow these instructions at home: Take over-the-counter and prescription medicines only as told by your health care provider. Follow a healthy meal plan that includes lean proteins, complex carbohydrates, fresh fruits and vegetables, low-fat dairy products, healthy fats, and fiber. If you are overweight, lose weight as told by your health care provider. Your health care provider can determine how much weight loss is best for you and  can help you lose weight safely. Keep all follow-up visits. This is important. Contact a health care provider if: Your symptoms do not get better with medicine. Your symptoms get worse or you develop new symptoms. Summary Polycystic ovarian syndrome (PCOS) is a common hormonal  disorder among women of reproductive age. PCOS can cause problems with menstrual periods and make it hard to get and stay pregnant. If this condition is not treated, it can lead to serious health problems, such as diabetes and heart disease. There is no cure for this condition, but treatment can help to manage symptoms and prevent more health problems from developing. This information is not intended to replace advice given to you by your health care provider. Make sure you discuss any questions you have with your health care provider. Document Revised: 06/28/2023 Document Reviewed: 06/28/2023 Elsevier Patient Education  2024 [employer].

## 2024-08-18 NOTE — Progress Notes (Signed)
 "  Julia Doing, DNP, AGNP-c [facility]  [address] [place], [place] [address] [phone]   ESTABLISHED PATIENT- Chronic Health and/or Follow-Up Visit on 08/18/2024  Blood pressure 120/78, pulse 78, weight 136 lb 9.6 oz (62 kg), last menstrual period 06/07/2024.   Subjective:  other (Issues with PCOS, talk about treatment, acne bad, not bleeding, and bad cramps, knows BC is an option but rather avoid that if she can, no obgyn, ) Last seen 08/28/2025_ New diagnosis of PCOS.   History of Present Illness Julia Vasquez is a 23 year old female with PCOS who presents with acne and a history of a concerning lump in the genital area.  She has significant acne, described as 'really, really bad', and is concerned about potential scarring. The acne affects her confidence, and she is seeking treatment options. She has not been using any specific acne medications recently and prefers non-hormonal options, although she is willing to try this if there are no other options.   She reports a recent occurrence of a lump in her genital area, described as larger than a typical razor bump and located in an area where hair does not usually grow (medial to left labia majora on the superior aspect). The lump was not red or painful unless touched and has since resolved. No redness or significant pain associated with the genital lump. No concerns/risks for STI. No other symptoms.   She experiences bad cramps, although she is not currently bleeding. She uses Tylenol  or Motrin  for relief but describes the cramps as 'weird,' possibly related to ovarian cysts associated with her PCOS, indicating the area of pain tends to be located to the right and left of the uterus. The cramps are localized and occur intermittently, potentially related to ovulation.  Her testosterone  levels have been noted to be high. She has not yet completed an ultrasound at [facility], which was previously ordered  to further evaluate her condition.  She works at a [employer]. Reports frequent urination and dry skin.  ROS negative except for what is listed in HPI. History, Medications, Surgery, SDOH, and Family History reviewed and updated as appropriate.  Objective:  Physical Exam Vitals and nursing note reviewed.  Constitutional:      Appearance: Normal appearance.  HENT:     Head: Normocephalic.  Eyes:     Pupils: Pupils are equal, round, and reactive to light.  Cardiovascular:     Rate and Rhythm: Normal rate and regular rhythm.     Pulses: Normal pulses.     Heart sounds: Normal heart sounds.  Pulmonary:     Effort: Pulmonary effort is normal.     Breath sounds: Normal breath sounds.  Genitourinary:    Comments: Deferred, symptoms resolved.  Musculoskeletal:        General: Normal range of motion.     Cervical back: Normal range of motion.  Skin:    General: Skin is warm.     Comments: Scattered papules noted on the face with predominant congestion at the cheeks and forehead. No scarring observed.   Neurological:     General: No focal deficit present.     Mental Status: She is alert and oriented to person, place, and time.  Psychiatric:        Mood and Affect: Mood normal.         Assessment & Plan:   Assessment & Plan PCOS (polycystic ovarian syndrome) PCOS with elevated testosterone  levels, contributing to acne. Cramping appears  related to ovulation based on timing. No current bleeding issues. High testosterone  due to multiple follicles secreting testosterone , leading to hormonal imbalance explained. Will trial treatment for testosterone  suppression with spironolactone . Labs pending. - Started spironolactone , half a tablet in the morning for two weeks, then increase to a full tablet. - Checked electrolytes to monitor for potential imbalances due to spironolactone . - Will recheck electrolytes in eight weeks. - Use ibuprofen  or Motrin  for cramps. Orders:    spironolactone  (ALDACTONE ) 50 MG tablet; Take 1/2 tablets (25 mg total) by mouth daily for 14 days, THEN 1 tablet (50 mg total) daily.   Comprehensive metabolic panel with GFR  Cystic acne vulgaris Mixture of acne vulgaris and cystic acne likely exacerbated by high testosterone  levels from PCOS. Prefers non-hormonal treatment options. Spironolactone  chosen as first-line treatment due to its testosterone  suppression properties and personal experience of effectiveness. - Started spironolactone  as outlined for PCOS. - Provided information on tretinoin for potential future use if needed. Orders:   spironolactone  (ALDACTONE ) 50 MG tablet; Take 1/2 tablets (25 mg total) by mouth daily for 14 days, THEN 1 tablet (50 mg total) daily.   Comprehensive metabolic panel with GFR  Labial cyst Resolved labial gland cyst, possibly related to [facility] gland. Cyst still present with occasional pain when touched; monitor for recurrence. - Apply warm compresses if cyst recurs and notify the office for evaluation - Monitor for recurrence or symptoms such as redness or increased size.      Julia Vasquez E Julia Humes, DNP, AGNP-c    "

## 2024-08-19 LAB — COMPREHENSIVE METABOLIC PANEL WITH GFR
ALT: 14 IU/L (ref 0–32)
AST: 18 IU/L (ref 0–40)
Albumin: 4.6 g/dL (ref 4.0–5.0)
Alkaline Phosphatase: 74 IU/L (ref 41–116)
BUN/Creatinine Ratio: 14 (ref 9–23)
BUN: 11 mg/dL (ref 6–20)
Bilirubin Total: 0.3 mg/dL (ref 0.0–1.2)
CO2: 24 mmol/L (ref 20–29)
Calcium: 9.8 mg/dL (ref 8.7–10.2)
Chloride: 103 mmol/L (ref 96–106)
Creatinine, Ser: 0.79 mg/dL (ref 0.57–1.00)
Globulin, Total: 2.6 g/dL (ref 1.5–4.5)
Glucose: 91 mg/dL (ref 70–99)
Potassium: 4.7 mmol/L (ref 3.5–5.2)
Sodium: 141 mmol/L (ref 134–144)
Total Protein: 7.2 g/dL (ref 6.0–8.5)
eGFR: 106 mL/min/1.73

## 2024-08-23 ENCOUNTER — Other Ambulatory Visit (HOSPITAL_COMMUNITY): Payer: Self-pay

## 2024-08-23 ENCOUNTER — Telehealth: Payer: Self-pay | Admitting: Nurse Practitioner

## 2024-08-23 ENCOUNTER — Ambulatory Visit: Payer: Self-pay | Admitting: Nurse Practitioner

## 2024-08-23 DIAGNOSIS — J111 Influenza due to unidentified influenza virus with other respiratory manifestations: Secondary | ICD-10-CM

## 2024-08-23 MED ORDER — OSELTAMIVIR PHOSPHATE 75 MG PO CAPS
75.0000 mg | ORAL_CAPSULE | Freq: Two times a day (BID) | ORAL | 0 refills | Status: DC
  Filled 2024-08-23: qty 10, 5d supply, fill #0

## 2024-08-23 MED ORDER — IBUPROFEN 800 MG PO TABS
800.0000 mg | ORAL_TABLET | Freq: Three times a day (TID) | ORAL | 0 refills | Status: AC | PRN
  Filled 2024-08-23: qty 30, 10d supply, fill #0

## 2024-08-23 MED ORDER — PSEUDOEPHEDRINE HCL 30 MG PO TABS
30.0000 mg | ORAL_TABLET | ORAL | 1 refills | Status: AC | PRN
  Filled 2024-08-23: qty 35, fill #0

## 2024-08-23 NOTE — Telephone Encounter (Signed)
 Pt has sudden onset of flu symptoms, fever, chills, body aches, hot & cold sweats, nausea, vomiting, headache, earache, chest pain, cough & congestion & really bad back pain said worse back pain ever, worse than period cramps.  Can't eat and drinking very little.  Took Covid test today & was neg, doesn't have a Flu test.  Said can't lay down and sleep due to back pain.

## 2024-08-23 NOTE — Telephone Encounter (Signed)
 Tamiflu , pseudoephedrine, and ibuprofen  sent to pharmacy for symptom management. MyChart message sent to patient with recommendations for additional over the counter measures that can help with symptoms.

## 2024-08-25 NOTE — Telephone Encounter (Signed)
 Pt informed

## 2024-09-22 ENCOUNTER — Other Ambulatory Visit (HOSPITAL_COMMUNITY): Payer: Self-pay

## 2024-09-22 ENCOUNTER — Other Ambulatory Visit: Payer: Self-pay

## 2024-09-22 DIAGNOSIS — J111 Influenza due to unidentified influenza virus with other respiratory manifestations: Secondary | ICD-10-CM

## 2024-09-22 MED ORDER — OSELTAMIVIR PHOSPHATE 75 MG PO CAPS
75.0000 mg | ORAL_CAPSULE | Freq: Two times a day (BID) | ORAL | 0 refills | Status: AC
  Filled 2024-09-22: qty 10, 5d supply, fill #0

## 2024-10-19 ENCOUNTER — Other Ambulatory Visit
